# Patient Record
Sex: Female | Born: 1945 | Race: White | Hispanic: No | Marital: Married | Smoking: Current every day smoker
Health system: Southern US, Community
[De-identification: ages and names within clinical notes are randomized; demographics above are authoritative.]

## PROBLEM LIST (undated history)

## (undated) DIAGNOSIS — E7800 Pure hypercholesterolemia, unspecified: Secondary | ICD-10-CM

## (undated) DIAGNOSIS — K589 Irritable bowel syndrome without diarrhea: Secondary | ICD-10-CM

## (undated) DIAGNOSIS — I10 Essential (primary) hypertension: Secondary | ICD-10-CM

## (undated) DIAGNOSIS — C50919 Malignant neoplasm of unspecified site of unspecified female breast: Secondary | ICD-10-CM

## (undated) DIAGNOSIS — C50912 Malignant neoplasm of unspecified site of left female breast: Secondary | ICD-10-CM

## (undated) HISTORY — DX: Malignant neoplasm of unspecified site of left female breast: C50.912

## (undated) HISTORY — DX: Malignant neoplasm of unspecified site of unspecified female breast: C50.919

## (undated) HISTORY — PX: MASTECTOMY: SHX3

---

## 2006-10-04 ENCOUNTER — Ambulatory Visit: Payer: Self-pay | Admitting: Cardiology

## 2006-12-21 ENCOUNTER — Ambulatory Visit: Payer: Self-pay | Admitting: Physician Assistant

## 2006-12-21 ENCOUNTER — Ambulatory Visit: Payer: Self-pay | Admitting: Cardiology

## 2006-12-25 ENCOUNTER — Encounter: Admission: RE | Admit: 2006-12-25 | Discharge: 2006-12-25 | Payer: Self-pay | Admitting: *Deleted

## 2006-12-28 ENCOUNTER — Ambulatory Visit: Payer: Self-pay | Admitting: Cardiology

## 2007-01-30 ENCOUNTER — Ambulatory Visit: Payer: Self-pay | Admitting: Cardiology

## 2008-06-16 ENCOUNTER — Encounter: Admission: RE | Admit: 2008-06-16 | Discharge: 2008-06-16 | Payer: Self-pay | Admitting: Family Medicine

## 2008-06-27 ENCOUNTER — Encounter (INDEPENDENT_AMBULATORY_CARE_PROVIDER_SITE_OTHER): Payer: Self-pay | Admitting: Diagnostic Radiology

## 2008-06-27 ENCOUNTER — Encounter: Admission: RE | Admit: 2008-06-27 | Discharge: 2008-06-27 | Payer: Self-pay | Admitting: Family Medicine

## 2008-07-04 ENCOUNTER — Encounter: Admission: RE | Admit: 2008-07-04 | Discharge: 2008-07-04 | Payer: Self-pay | Admitting: Family Medicine

## 2008-07-08 ENCOUNTER — Encounter: Admission: RE | Admit: 2008-07-08 | Discharge: 2008-07-08 | Payer: Self-pay | Admitting: Family Medicine

## 2008-07-16 ENCOUNTER — Encounter (INDEPENDENT_AMBULATORY_CARE_PROVIDER_SITE_OTHER): Payer: Self-pay | Admitting: Diagnostic Radiology

## 2008-07-16 ENCOUNTER — Encounter: Admission: RE | Admit: 2008-07-16 | Discharge: 2008-07-16 | Payer: Self-pay | Admitting: Family Medicine

## 2008-07-17 ENCOUNTER — Ambulatory Visit: Admission: RE | Admit: 2008-07-17 | Discharge: 2008-08-11 | Payer: Self-pay | Admitting: Radiation Oncology

## 2008-07-17 ENCOUNTER — Encounter: Admission: RE | Admit: 2008-07-17 | Discharge: 2008-07-17 | Payer: Self-pay | Admitting: Family Medicine

## 2008-07-17 ENCOUNTER — Encounter (INDEPENDENT_AMBULATORY_CARE_PROVIDER_SITE_OTHER): Payer: Self-pay | Admitting: Radiology

## 2008-08-27 ENCOUNTER — Ambulatory Visit: Payer: Self-pay | Admitting: Oncology

## 2008-08-27 LAB — CBC WITH DIFFERENTIAL/PLATELET
BASO%: 0.8 % (ref 0.0–2.0)
Basophils Absolute: 0.1 10*3/uL (ref 0.0–0.1)
EOS%: 11.1 % — ABNORMAL HIGH (ref 0.0–7.0)
MCH: 31.7 pg (ref 26.0–34.0)
MCHC: 34 g/dL (ref 32.0–36.0)
MCV: 93.4 fL (ref 81.0–101.0)
MONO%: 8.2 % (ref 0.0–13.0)
RDW: 12.4 % (ref 11.3–14.5)
lymph#: 2 10*3/uL (ref 0.9–3.3)

## 2008-08-28 LAB — COMPREHENSIVE METABOLIC PANEL
ALT: 14 U/L (ref 0–35)
AST: 14 U/L (ref 0–37)
Albumin: 4.3 g/dL (ref 3.5–5.2)
Alkaline Phosphatase: 90 U/L (ref 39–117)
BUN: 9 mg/dL (ref 6–23)
Calcium: 9.1 mg/dL (ref 8.4–10.5)
Chloride: 105 mEq/L (ref 96–112)
Creatinine, Ser: 0.81 mg/dL (ref 0.40–1.20)
Potassium: 4.2 mEq/L (ref 3.5–5.3)

## 2008-10-24 ENCOUNTER — Ambulatory Visit: Payer: Self-pay | Admitting: Oncology

## 2008-10-28 LAB — CBC WITH DIFFERENTIAL/PLATELET
BASO%: 1.2 % (ref 0.0–2.0)
Basophils Absolute: 0.1 10*3/uL (ref 0.0–0.1)
EOS%: 3.1 % (ref 0.0–7.0)
HCT: 38.7 % (ref 34.8–46.6)
HGB: 13.2 g/dL (ref 11.6–15.9)
LYMPH%: 37.1 % (ref 14.0–49.7)
MCH: 31.5 pg (ref 25.1–34.0)
MCHC: 34.2 g/dL (ref 31.5–36.0)
MCV: 92.1 fL (ref 79.5–101.0)
MONO%: 8.1 % (ref 0.0–14.0)
NEUT%: 50.5 % (ref 38.4–76.8)
Platelets: 177 10*3/uL (ref 145–400)

## 2008-12-19 ENCOUNTER — Emergency Department (HOSPITAL_COMMUNITY): Admission: EM | Admit: 2008-12-19 | Discharge: 2008-12-19 | Payer: Self-pay | Admitting: Emergency Medicine

## 2009-01-02 ENCOUNTER — Ambulatory Visit: Payer: Self-pay | Admitting: Oncology

## 2009-01-06 ENCOUNTER — Encounter: Payer: Self-pay | Admitting: Cardiology

## 2009-01-16 LAB — RESEARCH LABS

## 2009-05-21 ENCOUNTER — Ambulatory Visit: Payer: Self-pay | Admitting: Oncology

## 2009-05-25 ENCOUNTER — Encounter: Payer: Self-pay | Admitting: Cardiology

## 2009-05-25 LAB — CBC WITH DIFFERENTIAL/PLATELET
BASO%: 0.7 % (ref 0.0–2.0)
Basophils Absolute: 0 10*3/uL (ref 0.0–0.1)
MCH: 29.1 pg (ref 25.1–34.0)
MCV: 87.3 fL (ref 79.5–101.0)
MONO#: 0.6 10*3/uL (ref 0.1–0.9)
NEUT#: 3.2 10*3/uL (ref 1.5–6.5)
NEUT%: 55.2 % (ref 38.4–76.8)
Platelets: 243 10*3/uL (ref 145–400)
RBC: 4.28 10*6/uL (ref 3.70–5.45)

## 2009-05-26 LAB — COMPREHENSIVE METABOLIC PANEL
ALT: 15 U/L (ref 0–35)
AST: 16 U/L (ref 0–37)
Albumin: 3.8 g/dL (ref 3.5–5.2)
BUN: 7 mg/dL (ref 6–23)
Calcium: 8.7 mg/dL (ref 8.4–10.5)
Chloride: 105 mEq/L (ref 96–112)
Potassium: 4.1 mEq/L (ref 3.5–5.3)
Sodium: 140 mEq/L (ref 135–145)
Total Protein: 6.5 g/dL (ref 6.0–8.3)

## 2009-10-06 ENCOUNTER — Ambulatory Visit (HOSPITAL_BASED_OUTPATIENT_CLINIC_OR_DEPARTMENT_OTHER): Admission: RE | Admit: 2009-10-06 | Discharge: 2009-10-06 | Payer: Self-pay | Admitting: Plastic Surgery

## 2009-11-19 ENCOUNTER — Ambulatory Visit: Payer: Self-pay | Admitting: Oncology

## 2009-11-23 ENCOUNTER — Encounter: Payer: Self-pay | Admitting: Cardiology

## 2009-11-23 LAB — COMPREHENSIVE METABOLIC PANEL
AST: 20 U/L (ref 0–37)
BUN: 11 mg/dL (ref 6–23)
Calcium: 8.9 mg/dL (ref 8.4–10.5)
Chloride: 106 mEq/L (ref 96–112)
Creatinine, Ser: 0.88 mg/dL (ref 0.40–1.20)

## 2009-11-23 LAB — CBC WITH DIFFERENTIAL/PLATELET
Basophils Absolute: 0 10*3/uL (ref 0.0–0.1)
EOS%: 2.5 % (ref 0.0–7.0)
HCT: 37.7 % (ref 34.8–46.6)
HGB: 13 g/dL (ref 11.6–15.9)
LYMPH%: 30 % (ref 14.0–49.7)
MCH: 31.8 pg (ref 25.1–34.0)
MCV: 92.4 fL (ref 79.5–101.0)
MONO%: 7.3 % (ref 0.0–14.0)
NEUT%: 59.4 % (ref 38.4–76.8)
Platelets: 193 10*3/uL (ref 145–400)

## 2010-07-12 ENCOUNTER — Ambulatory Visit (HOSPITAL_COMMUNITY): Admission: RE | Admit: 2010-07-12 | Discharge: 2010-07-12 | Payer: Self-pay | Admitting: Oncology

## 2010-07-19 ENCOUNTER — Ambulatory Visit: Payer: Self-pay | Admitting: Oncology

## 2010-07-21 ENCOUNTER — Encounter: Payer: Self-pay | Admitting: Cardiology

## 2010-07-21 LAB — COMPREHENSIVE METABOLIC PANEL
ALT: 12 U/L (ref 0–35)
AST: 17 U/L (ref 0–37)
Alkaline Phosphatase: 79 U/L (ref 39–117)
BUN: 9 mg/dL (ref 6–23)
Creatinine, Ser: 0.89 mg/dL (ref 0.40–1.20)
Potassium: 3.9 mEq/L (ref 3.5–5.3)
Sodium: 141 mEq/L (ref 135–145)
Total Bilirubin: 0.4 mg/dL (ref 0.3–1.2)

## 2010-07-21 LAB — CBC WITH DIFFERENTIAL/PLATELET
Eosinophils Absolute: 0.1 10*3/uL (ref 0.0–0.5)
LYMPH%: 27.3 % (ref 14.0–49.7)
MONO#: 0.4 10*3/uL (ref 0.1–0.9)
NEUT#: 3.1 10*3/uL (ref 1.5–6.5)
Platelets: 206 10*3/uL (ref 145–400)
RBC: 4.35 10*6/uL (ref 3.70–5.45)
RDW: 12 % (ref 11.2–14.5)
WBC: 5.1 10*3/uL (ref 3.9–10.3)

## 2010-09-26 ENCOUNTER — Encounter: Payer: Self-pay | Admitting: Family Medicine

## 2010-10-05 NOTE — Letter (Signed)
Summary: Internal Correspondence/ [facility]  Internal Correspondence/ [facility]   Imported By: Dorise Hiss 12/08/2009 15:53:38  _____________________________________________________________________  External Attachment:    Type:   Image     Comment:   External Document

## 2010-10-07 NOTE — Letter (Signed)
Summary: Internal Correspondence/ [facility]  Internal Correspondence/ [facility]   Imported By: Dorise Hiss 09/28/2010 12:22:16  _____________________________________________________________________  External Attachment:    Type:   Image     Comment:   External Document

## 2010-11-21 LAB — BASIC METABOLIC PANEL
BUN: 9 mg/dL (ref 6–23)
CO2: 28 mEq/L (ref 19–32)
Chloride: 103 mEq/L (ref 96–112)
GFR calc non Af Amer: >60 mL/min (ref >60)
Glucose, Bld: 127 mg/dL — ABNORMAL HIGH (ref 70–99)
Potassium: 4.4 mEq/L (ref 3.5–5.1)
Sodium: 138 mEq/L (ref 135–145)

## 2010-12-15 LAB — POCT I-STAT, CHEM 8
BUN: 6 mg/dL (ref 6–23)
Chloride: 104 mEq/L (ref 96–112)
Creatinine, Ser: 0.8 mg/dL (ref 0.4–1.2)
Glucose, Bld: 97 mg/dL (ref 70–99)
Potassium: 4.3 mEq/L (ref 3.5–5.1)
Sodium: 138 mEq/L (ref 135–145)

## 2010-12-15 LAB — COMPREHENSIVE METABOLIC PANEL
ALT: 30 U/L (ref 0–35)
BUN: 7 mg/dL (ref 6–23)
CO2: 27 mEq/L (ref 19–32)
Calcium: 8.5 mg/dL (ref 8.4–10.5)
GFR calc non Af Amer: >60 mL/min (ref >60)
Glucose, Bld: 96 mg/dL (ref 70–99)
Sodium: 139 mEq/L (ref 135–145)

## 2010-12-15 LAB — PROTIME-INR
INR: 0.9 (ref 0.00–1.49)
Prothrombin Time: 12.7 seconds (ref 11.6–15.2)

## 2010-12-15 LAB — CBC
HCT: 38.8 % (ref 36.0–46.0)
Hemoglobin: 12.8 g/dL (ref 12.0–15.0)
MCHC: 32.9 g/dL (ref 30.0–36.0)
MCV: 93.7 fL (ref 78.0–100.0)
RBC: 4.14 MIL/uL (ref 3.87–5.11)

## 2010-12-15 LAB — DIFFERENTIAL
Basophils Relative: 1 % (ref 0–1)
Eosinophils Absolute: 0.3 10*3/uL (ref 0.0–0.7)
Lymphs Abs: 1.1 10*3/uL (ref 0.7–4.0)
Neutro Abs: 2.9 10*3/uL (ref 1.7–7.7)
Neutrophils Relative %: 54 % (ref 43–77)

## 2011-01-19 ENCOUNTER — Other Ambulatory Visit: Payer: Self-pay | Admitting: Oncology

## 2011-01-19 ENCOUNTER — Encounter (HOSPITAL_BASED_OUTPATIENT_CLINIC_OR_DEPARTMENT_OTHER): Payer: Medicare Other | Admitting: Oncology

## 2011-01-19 DIAGNOSIS — Z170 Estrogen receptor positive status [ER+]: Secondary | ICD-10-CM

## 2011-01-19 DIAGNOSIS — C50919 Malignant neoplasm of unspecified site of unspecified female breast: Secondary | ICD-10-CM

## 2011-01-19 LAB — CBC WITH DIFFERENTIAL/PLATELET
Basophils Absolute: 0 10*3/uL (ref 0.0–0.1)
EOS%: 1.4 % (ref 0.0–7.0)
Eosinophils Absolute: 0.1 10*3/uL (ref 0.0–0.5)
HCT: 38.9 % (ref 34.8–46.6)
HGB: 13 g/dL (ref 11.6–15.9)
LYMPH%: 20.4 % (ref 14.0–49.7)
MCH: 30.2 pg (ref 25.1–34.0)
MCV: 90.3 fL (ref 79.5–101.0)
MONO%: 9.1 % (ref 0.0–14.0)
NEUT#: 5.4 10*3/uL (ref 1.5–6.5)
NEUT%: 68.8 % (ref 38.4–76.8)
Platelets: 152 10*3/uL (ref 145–400)
RDW: 12.3 % (ref 11.2–14.5)

## 2011-01-21 NOTE — Assessment & Plan Note (Signed)
[facility]                          [facility] NOTE   JENNAVECIA, SCHWIER       MRN:          [id number]  DATE:12/21/2006                            DOB:          [birth year]    PRIMARY CARE PHYSICIAN:  Dr. Reuel Boom.   HISTORY OF PRESENT ILLNESS:  Ms. Tammy Ewing is a 65 year old female patient  with no known history of coronary artery disease who is referred to the  office today for tachycardia by her primary care physician.  She has  been fairly asymptomatic with her tachycardia.  This was noted at [facility] several months ago.  She did not know that her heart rate was fast.  She has seen Dr. Reuel Boom, who has checked a thyroid level.  Apparently,  her TSH was mildly elevated.  She does not require therapy at this time.  She denies any palpitations, chest pain, shortness of breath.  She  denies any syncope.  She does note an episode of near-syncope after  getting out of bed and getting into the shower.  This lasted for several  minutes, but eventually resolved.  It sounds as though she was probably  having a vagal episode.  She denies orthopnea, paroxysmal nocturnal  dyspnea, or edema.  She denies dyspnea on exertion.  She does note  fatigue.  This has been ongoing for quite some time now.  Her husband is  with her today, who also notes that she snores.   PAST MEDICAL HISTORY:  Significant for treated dyslipidemia, irritable  bowel syndrome, osteopenia.  She is status post cholecystectomy,  oophorectomy, cyst removed from her back, breast biopsy, and  tonsillectomy.  She denies any history of hypertension, diabetes  mellitus.   CURRENT MEDICATIONS:  1. Imipramine 25 mg a day.  2. Metoprolol 25 mg a day.  3. Simvastatin 40 mg a day.  4. Zyrtec 10 mg a day.  5. Aspirin 81 mg a day.  6. Vitamin.  7. Calcium.  8. Fiber.  9. Fosamax.   ALLERGIES:  None.   SOCIAL HISTORY:  She smokes a pack a day and has smoked for  about 40  years.  Denies alcohol or drug abuse.  She is married.  She has 1 son  who is 69 years old.  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   FAMILY HISTORY:  Significant for coronary artery disease.  Her mother  had a myocardial infarction in her 96s.  She is still alive.   REVIEW OF SYSTEMS:  Please see HPI.  She denies any fevers, chills,  cough.  No hematochezia, hematuria, or dysuria.  She has occasional  dyspepsia.  The rest of the review of systems are negative.   PHYSICAL EXAM:  She is a well-nourished, well-developed female in no  acute distress.  Blood pressure 119/75, pulse 77, weight 168 pounds.  HEENT:  Normocephalic, atraumatic.  PERRLA.  Maxilla clear.  NECK:  Without JVD.  No lymphadenopathy.  No thyromegaly.  Carotids  without bruits bilaterally.  CARDIAC:  S1, S2.  Regular rate and rhythm without murmurs.  LUNGS:  Clear to auscultation bilaterally without wheeze, rales, or  rhonchi.  ABDOMEN:  Soft and nontender.  Normoactive bowel sounds.  No  organomegaly.  EXTREMITIES:  Without edema.  Calves are soft and nontender.  SKIN:  Warm and dry.  NEUROLOGIC:  She is alert and oriented x3.  Cranial nerves 2-12 are  grossly intact.  Dorsalis pedis and posterior tibialis pulses 2+ bilaterally.   ELECTROCARDIOGRAM:  Sinus rhythm with heart rate of 71.  No acute  changes.   IMPRESSION:  1. Tachycardia.  2. Treated dyslipidemia.  3. Sub-clinical hypothyroidism.  4. Irritable bowel syndrome.  5. Osteopenia.  6. Family history of coronary artery disease.  7. Smoker.  8. Fatigue.   PLAN:  The patient presents to the office today for evaluation of  tachycardia.  A recent stress nuclear test was negative for ischemia.  She has also been set up for an echocardiogram, which was done  yesterday.  She is fairly asymptomatic with the tachycardia.  She did  have 1 near-syncopal episode about a month ago.  This sounds as though  she did have a  vasovagal episode.  She notes symptoms of snoring and  fatigue.  We will set her up for a sleep study to rule out sleep apnea.  We will also place her on a 48 hour Holter monitor to rule out  significant arrhythmias.  Imipramine is a medication that can cause  tachycardia.  We have recommended to her to go ahead and hold this for  now to see if this alleviates some of the tachycardia.  We will await  the results of her echocardiogram and try to obtain results from Dr.  Reuel Boom from recent  blood work.  We will have her followup with Dr. Andee Lineman in 1 month's  time.  I did discuss the patient's case today with Dr. Andee Lineman.      Tereso Newcomer, PA-C  Electronically Signed      Learta Codding, MD,FACC  Electronically Signed   SW/MedQ  DD: 12/21/2006  DT: 12/21/2006  Job #: [id number]   cc:   Donzetta Sprung

## 2011-07-14 ENCOUNTER — Ambulatory Visit (HOSPITAL_COMMUNITY)
Admission: RE | Admit: 2011-07-14 | Discharge: 2011-07-14 | Disposition: A | Discharge status: Home / Self Care | Payer: Medicare Other | Source: Ambulatory Visit | Attending: Oncology | Admitting: Oncology

## 2011-07-14 ENCOUNTER — Ambulatory Visit (HOSPITAL_BASED_OUTPATIENT_CLINIC_OR_DEPARTMENT_OTHER): Payer: Medicare Other

## 2011-07-14 ENCOUNTER — Other Ambulatory Visit: Payer: Self-pay | Admitting: Oncology

## 2011-07-14 DIAGNOSIS — C50919 Malignant neoplasm of unspecified site of unspecified female breast: Secondary | ICD-10-CM

## 2011-07-14 DIAGNOSIS — Z170 Estrogen receptor positive status [ER+]: Secondary | ICD-10-CM

## 2011-07-14 DIAGNOSIS — C50219 Malignant neoplasm of upper-inner quadrant of unspecified female breast: Secondary | ICD-10-CM

## 2011-07-14 LAB — CBC WITH DIFFERENTIAL/PLATELET
Eosinophils Absolute: 0.1 10*3/uL (ref 0.0–0.5)
HCT: 41.3 % (ref 34.8–46.6)
LYMPH%: 25.8 % (ref 14.0–49.7)
MCV: 91.1 fL (ref 79.5–101.0)
MONO#: 0.4 10*3/uL (ref 0.1–0.9)
MONO%: 7.8 % (ref 0.0–14.0)
NEUT#: 3.4 10*3/uL (ref 1.5–6.5)
NEUT%: 64.4 % (ref 38.4–76.8)
Platelets: 186 10*3/uL (ref 145–400)
WBC: 5.3 10*3/uL (ref 3.9–10.3)

## 2011-07-14 LAB — COMPREHENSIVE METABOLIC PANEL
Alkaline Phosphatase: 80 U/L (ref 39–117)
BUN: 6 mg/dL (ref 6–23)
CO2: 24 mEq/L (ref 19–32)
Creatinine, Ser: 0.83 mg/dL (ref 0.50–1.10)
Glucose, Bld: 98 mg/dL (ref 70–99)
Total Bilirubin: 0.5 mg/dL (ref 0.3–1.2)
Total Protein: 6.6 g/dL (ref 6.0–8.3)

## 2011-07-14 LAB — CANCER ANTIGEN 27.29: CA 27.29: 10 U/mL (ref 0–39)

## 2011-07-21 ENCOUNTER — Encounter: Payer: Self-pay | Admitting: Physician Assistant

## 2011-07-21 ENCOUNTER — Ambulatory Visit (HOSPITAL_BASED_OUTPATIENT_CLINIC_OR_DEPARTMENT_OTHER): Payer: Medicare Other | Admitting: Physician Assistant

## 2011-07-21 VITALS — BP 164/71 | HR 76 | Temp 98.6°F | Ht 62.5 in | Wt 151.3 lb

## 2011-07-21 DIAGNOSIS — C50911 Malignant neoplasm of unspecified site of right female breast: Secondary | ICD-10-CM | POA: Insufficient documentation

## 2011-07-21 DIAGNOSIS — C50912 Malignant neoplasm of unspecified site of left female breast: Secondary | ICD-10-CM

## 2011-07-21 DIAGNOSIS — Z170 Estrogen receptor positive status [ER+]: Secondary | ICD-10-CM

## 2011-07-21 DIAGNOSIS — C50219 Malignant neoplasm of upper-inner quadrant of unspecified female breast: Secondary | ICD-10-CM

## 2011-07-21 DIAGNOSIS — C50919 Malignant neoplasm of unspecified site of unspecified female breast: Secondary | ICD-10-CM

## 2011-07-21 HISTORY — DX: Malignant neoplasm of unspecified site of left female breast: C50.912

## 2011-07-21 HISTORY — DX: Malignant neoplasm of unspecified site of unspecified female breast: C50.919

## 2011-07-21 NOTE — Progress Notes (Signed)
Hematology and Oncology Follow Up Visit  Tammy Ewing [id number] 04/30/1946 65 y.o. 07/21/2011 3:57 PM   Interim History:   The patient returns today for routine six-month followup of her bilateral breast carcinomas. She continues on letrozole which she began in January of 2012, having completed 2 years on tamoxifen tamoxifen. She is tolerating the letrozole well. A detailed review of systems is unremarkable. The patient keeps herself busy, and enjoys taking care of her great grandson who is almost 68 months old.  Physically, the patient has very few complaints today. She's had no recent illnesses. She has some occasional hot flashes with the letrozole, nothing that is problematic to her. She's had no increasing joint pain. No vaginal dryness. She is status post bilateral corneal transplants, one in February 2012, the second in April 2012. Her vision is much better, now 20/30. She has no double vision, no blurred vision.  A detailed review of systems is otherwise noncontributory as noted below.  Review of Systems: Constitutional:  no weight loss, fever, night sweats and feels well Eyes: negative [id number] Cardiovascular: no chest pain or dyspnea on exertion Respiratory: no cough, shortness of breath, or wheezing Neurological: negative Dermatological: negative Gastrointestinal: no abdominal pain, change in bowel habits, or black or bloody stools Genito-Urinary: no dysuria, trouble voiding, or hematuria Hematological and Lymphatic: negative Breast: negative for breast lumps Musculoskeletal: negative Remaining ROS negative.  Medications: I have reviewed the patient's current medications.  Allergies:  Allergies  Allergen Reactions  . Lyrica Other (See Comments)    "Hallucinations"     Physical Exam:  Blood pressure 164/71, pulse 76, temperature 98.6 F (37 C), height 5' 2.5" (1.588 m), weight 151 lb 4.8 oz (68.629 kg). HEENT:  Sclerae anicteric, conjunctivae pink.   Oropharynx clear.  No mucositis or candidiasis.  Nodes:  No cervical, supraclavicular, or axillary lymphadenopathy palpated.  Breast Exam: The patient is status post bilateral mastectomies. Well-healed incisions, with no nodularity, skin changes, or evidence of local recurrence in the chest wall.  Lungs:  Clear to auscultation bilaterally.  No crackles, rhonchi, or wheezes.  Heart:  Regular rate and rhythm.  Abdomen:  Soft, nontender.  Positive bowel sounds.  No organomegaly or masses palpated.  Musculoskeletal:  No focal spinal tenderness to palpation.  Extremities:  Benign.  No peripheral edema or cyanosis.  Skin:  Benign.  Neuro:  Nonfocal.   Lab Results: Lab Results  Component Value Date   WBC 5.3 07/14/2011   HGB 13.9 07/14/2011   HCT 41.3 07/14/2011   MCV 91.1 07/14/2011   PLT 186 07/14/2011   NEUTROABS 2.9 12/19/2008     Chemistry      Component Value Date/Time   NA 138 07/14/2011 1153   NA 138 07/14/2011 1153   K 3.7 07/14/2011 1153   K 3.7 07/14/2011 1153   CL 104 07/14/2011 1153   CL 104 07/14/2011 1153   CO2 24 07/14/2011 1153   CO2 24 07/14/2011 1153   BUN 6 07/14/2011 1153   BUN 6 07/14/2011 1153   CREATININE 0.83 07/14/2011 1153   CREATININE 0.83 07/14/2011 1153      Component Value Date/Time   CALCIUM 9.3 07/14/2011 1153   CALCIUM 9.3 07/14/2011 1153   ALKPHOS 80 07/14/2011 1153   ALKPHOS 80 07/14/2011 1153   AST 13 07/14/2011 1153   AST 13 07/14/2011 1153   ALT <8 07/14/2011 1153   ALT <8 07/14/2011 1153   BILITOT 0.5 07/14/2011 1153   BILITOT 0.5 07/14/2011 1153  Radiological Studies: Chest x-ray on 07/14/2011 is unremarkable. There was no acute cardiopulmonary disease, and no evidence of metastasis. The patient is due for her next bone density exam at [facility] in 2013, and these are typically ordered by her PCP, Dr. Reuel Boom.  Impression and Plan: 65 year old [place] woman status post bilateral mastectomies in November of 2009 for a stage IA breast  carcinoma. On the right, T1b N 0. On the left, T1c, N0. Both invasive ductal carcinomas, but ER and PR positive, both HER-2/neu negative, and both with a low proliferation fraction. On tamoxifen from December of 2009 until January 2012, at which time she was switched to letrozole. Continues now on letrozole 2.5 mg daily with good tolerance  The patient continues to tolerate letrozole well and we'll continue as planned, the goal being to continue until December 2014 at which time we will likely release her from followup. She tells me that Dr. Reuel Boom typically orders her letrozole for her and will continue to do so. Of course she knows she can contact us for refills as needed. She will also followup with Dr. Reuel Boom and make sure that her bone density is ordered for next year. She does see him every 5-6 months for routine followup and, per her report, is up-to-date with all health maintenance. The patient returned for retained followup here in May of 2013.  This plan was reviewed with the patient, who voices understanding and agreement.  She knows to call with any changes or problems.    Solenne Manwarren, PA-C 11/15/20123:57 PM

## 2011-07-22 ENCOUNTER — Other Ambulatory Visit: Payer: Self-pay | Admitting: *Deleted

## 2011-07-22 MED ORDER — LETROZOLE 2.5 MG PO TABS: 2.5000 mg | ORAL_TABLET | Freq: Every day | ORAL | Status: DC

## 2011-07-22 MED ORDER — MEGESTROL ACETATE 40 MG PO TABS: 40.0000 mg | ORAL_TABLET | Freq: Every day | ORAL | Status: DC

## 2012-01-09 ENCOUNTER — Other Ambulatory Visit (HOSPITAL_BASED_OUTPATIENT_CLINIC_OR_DEPARTMENT_OTHER): Payer: Medicare Other | Admitting: Lab

## 2012-01-09 DIAGNOSIS — C50919 Malignant neoplasm of unspecified site of unspecified female breast: Secondary | ICD-10-CM

## 2012-01-09 LAB — CBC WITH DIFFERENTIAL/PLATELET
Basophils Absolute: 0.1 10*3/uL (ref 0.0–0.1)
Eosinophils Absolute: 0.2 10*3/uL (ref 0.0–0.5)
HGB: 13.9 g/dL (ref 11.6–15.9)
LYMPH%: 25.7 % (ref 14.0–49.7)
MCV: 91.5 fL (ref 79.5–101.0)
MONO%: 10.6 % (ref 0.0–14.0)
NEUT#: 3.2 10*3/uL (ref 1.5–6.5)
Platelets: 195 10*3/uL (ref 145–400)

## 2012-01-09 LAB — COMPREHENSIVE METABOLIC PANEL
ALT: 12 U/L (ref 0–35)
BUN: 8 mg/dL (ref 6–23)
CO2: 26 mEq/L (ref 19–32)
Calcium: 9.1 mg/dL (ref 8.4–10.5)
Chloride: 107 mEq/L (ref 96–112)
Creatinine, Ser: 0.95 mg/dL (ref 0.50–1.10)
Glucose, Bld: 91 mg/dL (ref 70–99)

## 2012-01-16 ENCOUNTER — Telehealth: Payer: Self-pay | Admitting: Oncology

## 2012-01-16 ENCOUNTER — Ambulatory Visit (HOSPITAL_BASED_OUTPATIENT_CLINIC_OR_DEPARTMENT_OTHER): Payer: Medicare Other | Admitting: Oncology

## 2012-01-16 VITALS — BP 150/77 | HR 73 | Temp 98.3°F | Ht 62.5 in | Wt 160.5 lb

## 2012-01-16 DIAGNOSIS — C50919 Malignant neoplasm of unspecified site of unspecified female breast: Secondary | ICD-10-CM

## 2012-01-16 DIAGNOSIS — Z9010 Acquired absence of unspecified breast and nipple: Secondary | ICD-10-CM

## 2012-01-16 DIAGNOSIS — C50912 Malignant neoplasm of unspecified site of left female breast: Secondary | ICD-10-CM

## 2012-01-16 DIAGNOSIS — C50219 Malignant neoplasm of upper-inner quadrant of unspecified female breast: Secondary | ICD-10-CM

## 2012-01-16 DIAGNOSIS — C50911 Malignant neoplasm of unspecified site of right female breast: Secondary | ICD-10-CM

## 2012-01-16 DIAGNOSIS — Z171 Estrogen receptor negative status [ER-]: Secondary | ICD-10-CM

## 2012-01-16 MED ORDER — LETROZOLE 2.5 MG PO TABS: 2.5000 mg | ORAL_TABLET | Freq: Every day | ORAL | Status: DC

## 2012-01-16 NOTE — Telephone Encounter (Signed)
gve the pt her may 2013 appt calendar 

## 2012-01-16 NOTE — Progress Notes (Signed)
ID: Tammy Ewing   DOB: 1946-01-09  MR#: [id number]  WUJ#:[id number]  HISTORY OF PRESENT ILLNESS: She had screening mammography at [facility] on 06/16/2008.  This showed some microcalcifications and distortion in the right breast.  She proceeded to right mammography and ultrasound on 06/27/2008 and additional images showed a group of indeterminate calcifications with mild parenchymal distortion on the right.  Ultrasound showed an ill-defined region initially but this could not be reproduced on a second attempt.  A second area of ill-defined shadowing was noted at a different place correlating with the mammographic abnormality.  This second area was biopsied on the same day and showed ([id number]) a low-grade invasive ductal carcinoma.  The sampling measured a maximum of 1.9 cm in length.    With this information, the patient proceeded to bilateral breast MRIs on 07/04/2008.  This showed a dense enhancing fibronodular pattern bilaterally with multiple cysts in the right breast.  In the right breast there was post-biopsy change.  In the left breast, there was an enhancing mass measuring up to 1.6 cm.  This was further evaluated with ultrasonography on the same day and it confirmed the presence of an irregular hypoechoic area measuring 1.4 cm maximally.  With many fibrocystic changes in the breast, it was difficult to determine if the exact area was found.  Biopsy was performed under MRI guidance on 07/16/2008, but the final pathology ([id number]) showed benign breast tissue only.   Dr. Judyann Munson really felt this was discordant, so on the next day, on 07/17/2008, under ultrasound guidance, repeat left breast biopsy was obtained and this time it showed ([id number]) an invasive ductal carcinoma low grade with the maximal size of the sample being 8 mm.  Both tumors were low grade, both had low MIB-1 ratings, and both were strongly ER and PR positive and ER negative as detailed below.  With this information,  after much discussion and particularly given the patient's very complex and "busy" breast parenchyma, the patient decided on bilateral mastectomies and these were performed on 08/04/2008 under [facility].  The final pathology ([id number]) showed no residual tumor in either breast.  I should add that this was reviewed by Dr. [place], who did additional cuts. Her subsequent history is as detailed below.    INTERVAL HISTORY: [name] returns today with her husband Tammy Ewing for followup of her breast cancer. The interval history is chiefly significant or their spending a lot of time with her great-grandson Lem. The whole family recently went to the beach and this was a memorable location and 4 past.  REVIEW OF SYSTEMS: She is losing some of her hair doubtless due to the letrozole, but this is certainly not noticeable. Her hot flashes are much better than they used to be. She has some vaginal dryness but this has not changed. Her hearing aid is broken and she is considering whether she needs to get that replaced. Otherwise a detailed review of systems today was entirely negative  PAST MEDICAL HISTORY: Past Medical History  Diagnosis Date  . Breast cancer 07/21/2011  . Breast cancer, left breast 07/21/2011  She is status post bilateral salpingo-oophorectomy without hysterectomy.  She has a history of dyslipidemia. She has a history of corneal dystrophy.  She has a history of irritable bowel syndrome.  She is status post cholecystectomy, she is status post tonsillectomy and adenoidectomy.  She has a history of significant tobacco abuse, the patient quitting in November of this year after getting the news just discussed.  The  patient has a history of tachycardia, which was evaluated by Dr. Andee Lineman with no significant pathology found.  She has a history of subclinical hypothyroidism, a history of osteopenia, and a possible history of sleep apnea, not study confirmed.  FAMILY HISTORY The patient has no information  regarding her father.  Her mother is currently 55 years old.  She has a history of coronary artery disease and emphysema.  The patient has 2 brothers and 1 sister.  There is no history of ovarian, breast, or any other cancer in the immediate family.   GYNECOLOGIC HISTORY: She is GX, P1, first pregnancy age 52.  After having her BSO (which the patient insisted on for "prevention") she took hormone replacement until November 2011   SOCIAL HISTORY: She is retired from [employer], currently works at a [employer] part time.  Her husband, Tammy Ewing, is retired.  Their son, Tammy Ewing, lives in [place], is disabled after some workplace injury and has had, the patient says, approximately a dozen back surgeries.  The patient has a grandson who lives in [place] and a 62-year-old Surveyor, mining.  The patient is not a church attender.   ADVANCED DIRECTIVES: not in place  HEALTH MAINTENANCE: History  Substance Use Topics  . Smoking status: Current Everyday Smoker -- 1.0 packs/day for 50 years    Types: Cigarettes  . Smokeless tobacco: Never Used  . Alcohol Use:      Colonoscopy:  PAP:  Bone density: due 2014  Lipid panel:  Allergies  Allergen Reactions  . Pregabalin Other (See Comments)    "Hallucinations"    Current Outpatient Prescriptions  Medication Sig Dispense Refill  . imipramine (TOFRANIL) 25 MG tablet Take 25 mg by mouth at bedtime.        Marland Kitchen letrozole (FEMARA) 2.5 MG tablet Take 1 tablet (2.5 mg total) by mouth daily.  90 tablet  4  . levothyroxine (SYNTHROID, LEVOTHROID) 88 MCG tablet Take 88 mcg by mouth daily.        . metoprolol (TOPROL-XL) 50 MG 24 hr tablet Take 50 mg by mouth daily.        . polycarbophil (FIBERCON) 625 MG tablet Take 625 mg by mouth daily.        . prednisoLONE acetate (PRED MILD) 0.12 % ophthalmic suspension Place 1 drop into both eyes. Monday Wednesday and Friday      . simvastatin (ZOCOR) 40 MG tablet Take 40 mg by mouth at bedtime.        . metronidazole (NORITATE) 1  % cream Apply 1 application topically daily.          OBJECTIVE: Middle-aged white woman who appears well Filed Vitals:   01/16/12 1456  BP: 150/77  Pulse: 73  Temp: 98.3 F (36.8 C)     Body mass index is 28.89 kg/(m^2).    ECOG FS: 0  Sclerae unicteric Oropharynx clear No peripheral adenopathy Lungs no rales or rhonchi Heart regular rate and rhythm Abd benign MSK no focal spinal tenderness, no peripheral edema Neuro: nonfocal Breasts: Status post bilateral mastectomies. No evidence of local recurrence  LAB RESULTS: Lab Results  Component Value Date   WBC 5.5 01/09/2012   NEUTROABS 3.2 01/09/2012   HGB 13.9 01/09/2012   HCT 41.8 01/09/2012   MCV 91.5 01/09/2012   PLT 195 01/09/2012      Chemistry      Component Value Date/Time   NA 142 01/09/2012 1050   K 4.0 01/09/2012 1050   CL 107 01/09/2012 1050  CO2 26 01/09/2012 1050   BUN 8 01/09/2012 1050   CREATININE 0.95 01/09/2012 1050      Component Value Date/Time   CALCIUM 9.1 01/09/2012 1050   ALKPHOS 88 01/09/2012 1050   AST 18 01/09/2012 1050   ALT 12 01/09/2012 1050   BILITOT 0.4 01/09/2012 1050       Lab Results  Component Value Date   LABCA2 15 01/09/2012    No components found with this basename: LABCA125    No results found for this basename: INR:1;PROTIME:1 in the last 168 hours  Urinalysis No results found for this basename: colorurine, appearanceur, labspec, phurine, glucoseu, hgbur, bilirubinur, ketonesur, proteinur, urobilinogen, nitrite, leukocytesur    STUDIES: No results found.  ASSESSMENT: 66 year old [place] woman, status post bilateral mastectomies, November 2009, for a right-sided T1B N0 and a left-sided T1C N0 invasive ductal carcinomas, both estrogen and progesterone receptor positive, both with a low MIB-1 and both HER2 negative.  She started tamoxifen in December 2009 and we switched her to letrozole in January of 2012.   PLAN: She is doing very well from my point of view. We are going to continue the  letrozole one more year. She will see me year from now, and likely we'll release her from followup here at that time   Alisah Grandberry C    01/16/2012

## 2012-05-11 ENCOUNTER — Other Ambulatory Visit: Payer: Self-pay | Admitting: Oncology

## 2012-05-11 DIAGNOSIS — C50919 Malignant neoplasm of unspecified site of unspecified female breast: Secondary | ICD-10-CM

## 2013-01-09 ENCOUNTER — Other Ambulatory Visit: Payer: Self-pay | Admitting: Physician Assistant

## 2013-01-09 DIAGNOSIS — C50912 Malignant neoplasm of unspecified site of left female breast: Secondary | ICD-10-CM

## 2013-01-09 DIAGNOSIS — C50911 Malignant neoplasm of unspecified site of right female breast: Secondary | ICD-10-CM

## 2013-01-10 ENCOUNTER — Other Ambulatory Visit (HOSPITAL_BASED_OUTPATIENT_CLINIC_OR_DEPARTMENT_OTHER): Payer: Medicare Other | Admitting: Lab

## 2013-01-10 DIAGNOSIS — C50919 Malignant neoplasm of unspecified site of unspecified female breast: Secondary | ICD-10-CM

## 2013-01-10 DIAGNOSIS — C50219 Malignant neoplasm of upper-inner quadrant of unspecified female breast: Secondary | ICD-10-CM

## 2013-01-10 DIAGNOSIS — C50911 Malignant neoplasm of unspecified site of right female breast: Secondary | ICD-10-CM

## 2013-01-10 DIAGNOSIS — C50912 Malignant neoplasm of unspecified site of left female breast: Secondary | ICD-10-CM

## 2013-01-10 LAB — CBC WITH DIFFERENTIAL/PLATELET
BASO%: 3 % — ABNORMAL HIGH (ref 0.0–2.0)
Basophils Absolute: 0.2 10*3/uL — ABNORMAL HIGH (ref 0.0–0.1)
EOS%: 2.6 % (ref 0.0–7.0)
Eosinophils Absolute: 0.2 10*3/uL (ref 0.0–0.5)
HCT: 41 % (ref 34.8–46.6)
HGB: 14.1 g/dL (ref 11.6–15.9)
LYMPH%: 30.2 % (ref 14.0–49.7)
MCH: 30.9 pg (ref 25.1–34.0)
MCHC: 34.3 g/dL (ref 31.5–36.0)
MCV: 90.1 fL (ref 79.5–101.0)
MONO#: 0.5 10*3/uL (ref 0.1–0.9)
MONO%: 9.1 % (ref 0.0–14.0)
NEUT#: 3.2 10*3/uL (ref 1.5–6.5)
NEUT%: 55.1 % (ref 38.4–76.8)
Platelets: 193 10*3/uL (ref 145–400)
RBC: 4.55 10*6/uL (ref 3.70–5.45)
RDW: 13 % (ref 11.2–14.5)
WBC: 5.8 10*3/uL (ref 3.9–10.3)
lymph#: 1.7 10*3/uL (ref 0.9–3.3)

## 2013-01-10 LAB — COMPREHENSIVE METABOLIC PANEL (CC13)
ALT: 9 U/L (ref 0–55)
AST: 14 U/L (ref 5–34)
Albumin: 3.7 g/dL (ref 3.5–5.0)
Alkaline Phosphatase: 99 U/L (ref 40–150)
BUN: 8.2 mg/dL (ref 7.0–26.0)
CO2: 24 mEq/L (ref 22–29)
Calcium: 9 mg/dL (ref 8.4–10.4)
Chloride: 107 mEq/L (ref 98–107)
Creatinine: 1.1 mg/dL (ref 0.6–1.1)
Glucose: 101 mg/dl — ABNORMAL HIGH (ref 70–99)
Potassium: 4.3 mEq/L (ref 3.5–5.1)
Sodium: 142 mEq/L (ref 136–145)
Total Bilirubin: 0.74 mg/dL (ref 0.20–1.20)
Total Protein: 6.6 g/dL (ref 6.4–8.3)

## 2013-01-17 ENCOUNTER — Ambulatory Visit (HOSPITAL_BASED_OUTPATIENT_CLINIC_OR_DEPARTMENT_OTHER): Payer: Medicare Other | Admitting: Oncology

## 2013-01-17 VITALS — BP 119/75 | HR 80 | Temp 98.2°F | Resp 20 | Ht 62.0 in | Wt 167.2 lb

## 2013-01-17 DIAGNOSIS — C50912 Malignant neoplasm of unspecified site of left female breast: Secondary | ICD-10-CM

## 2013-01-17 DIAGNOSIS — C50911 Malignant neoplasm of unspecified site of right female breast: Secondary | ICD-10-CM

## 2013-01-17 DIAGNOSIS — C50919 Malignant neoplasm of unspecified site of unspecified female breast: Secondary | ICD-10-CM

## 2013-01-17 NOTE — Progress Notes (Signed)
ID: Tammy Ewing   DOB: Sep 20, 1945  MR#: [id number]  CSN#:[id number]  PCP: Donzetta Sprung, MD   HISTORY OF PRESENT ILLNESS: She had screening mammography at [facility] on 06/16/2008.  This showed some microcalcifications and distortion in the right breast.  She proceeded to right mammography and ultrasound on 06/27/2008 and additional images showed a group of indeterminate calcifications with mild parenchymal distortion on the right.  Ultrasound showed an ill-defined region initially but this could not be reproduced on a second attempt.  A second area of ill-defined shadowing was noted at a different place correlating with the mammographic abnormality.  This second area was biopsied on the same day and showed ([id number]) a low-grade invasive ductal carcinoma.  The sampling measured a maximum of 1.9 cm in length.    With this information, the patient proceeded to bilateral breast MRIs on 07/04/2008.  This showed a dense enhancing fibronodular pattern bilaterally with multiple cysts in the right breast.  In the right breast there was post-biopsy change.  In the left breast, there was an enhancing mass measuring up to 1.6 cm.  This was further evaluated with ultrasonography on the same day and it confirmed the presence of an irregular hypoechoic area measuring 1.4 cm maximally.  With many fibrocystic changes in the breast, it was difficult to determine if the exact area was found.  Biopsy was performed under MRI guidance on 07/16/2008, but the final pathology ([id number]) showed benign breast tissue only.   Dr. Judyann Munson really felt this was discordant, so on the next day, on 07/17/2008, under ultrasound guidance, repeat left breast biopsy was obtained and this time it showed ([id number]) an invasive ductal carcinoma low grade with the maximal size of the sample being 8 mm.  Both tumors were low grade, both had low MIB-1 ratings, and both were strongly ER and PR positive and ER negative as detailed  below.  With this information, after much discussion and particularly given the patient's very complex and "busy" breast parenchyma, the patient decided on bilateral mastectomies and these were performed on 08/04/2008 under [facility].  The final pathology ([id number]) showed no residual tumor in either breast.  I should add that this was reviewed by Dr. [place], who did additional cuts. Her subsequent history is as detailed below.    INTERVAL HISTORY: Tammy Ewing returns today with her husband [name] for followup of her breast cancer. The interval history is unremarkable, except for her mother having died at the age of 47 after a fall. Tammy Ewing spending a lot of time taking care of her 68-year-old great-grandson however he is going to soon be in a school and she is a ready wondering what she is going to do with her time.  REVIEW OF SYSTEMS: The chest wound that had been giving her so much trouble dehiscedone more time. This time she went to the local wound center and Dr. Tanda Rockers took care of it. Richard packet for her. A close securely by secondary intention. She is very pleased with those results. 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 A detailed review of systems today was otherwise entirely negative and in particular she is having no unusual side effects from her letrozole.  PAST MEDICAL HISTORY: Past Medical History  Diagnosis Date  . Breast cancer 07/21/2011  . Breast cancer, left breast 07/21/2011  She is status post bilateral salpingo-oophorectomy without hysterectomy.  She  has a history of dyslipidemia. She has a history of corneal dystrophy.  She has a history of irritable bowel syndrome.  She is status post cholecystectomy, she is status post tonsillectomy and adenoidectomy.  She has a history of significant tobacco abuse, the patient quitting in November of this year after getting the news just  discussed.  The patient has a history of tachycardia, which was evaluated by Dr. Andee Lineman with no significant pathology found.  She has a history of subclinical hypothyroidism, a history of osteopenia, and a possible history of sleep apnea, not study confirmed.  FAMILY HISTORY The patient has no information regarding her father.  Her mother is currently 60 years old.  She has a history of coronary artery disease and emphysema.  The patient has 2 brothers and 1 sister.  There is no history of ovarian, breast, or any other cancer in the immediate family.   GYNECOLOGIC HISTORY: She is GX, P1, first pregnancy age 32.  After having her BSO (which the patient insisted on for "prevention") she took hormone replacement until November 2011   SOCIAL HISTORY: She is retired from [employer], currently works at a [employer] part time.  Her husband, [name], is retired.  Their son, Clide Cliff, lives in [place], is disabled after some workplace injury and has had, the patient says, approximately a dozen back surgeries.  The patient has a grandson who lives in [place] and a 52-year-old great-grandson.  The patient is not a church attender.   ADVANCED DIRECTIVES: not in place  HEALTH MAINTENANCE: History  Substance Use Topics  . Smoking status: Current Every Day Smoker -- 1.00 packs/day for 50 years    Types: Cigarettes  . Smokeless tobacco: Never Used  . Alcohol Use:      Colonoscopy:  PAP:  Bone density: due 2014  Lipid panel:  Allergies  Allergen Reactions  . Pregabalin Other (See Comments)    "Hallucinations"    Current Outpatient Prescriptions  Medication Sig Dispense Refill  . imipramine (TOFRANIL) 25 MG tablet Take 25 mg by mouth at bedtime.        Marland Kitchen letrozole (FEMARA) 2.5 MG tablet Take 1 tablet (2.5 mg total) by mouth daily.  90 tablet  12  . levothyroxine (SYNTHROID, LEVOTHROID) 88 MCG tablet Take 88 mcg by mouth daily.        . megestrol (MEGACE) 40 MG tablet TAKE 1 TABLET DAILY  90 tablet  2  .  metoprolol (TOPROL-XL) 50 MG 24 hr tablet Take 50 mg by mouth daily.        . metronidazole (NORITATE) 1 % cream Apply 1 application topically daily.        . polycarbophil (FIBERCON) 625 MG tablet Take 625 mg by mouth daily.        . prednisoLONE acetate (PRED MILD) 0.12 % ophthalmic suspension Place 1 drop into both eyes. Monday Wednesday and Friday      . simvastatin (ZOCOR) 40 MG tablet Take 40 mg by mouth at bedtime.         No current facility-administered medications for this visit.    OBJECTIVE: Middle-aged white woman who appears well Filed Vitals:   01/17/13 1352  BP: 119/75  Pulse: 80  Temp: 98.2 F (36.8 C)  Resp: 20     Body mass index is 30.57 kg/(m^2).    ECOG FS: 0  Sclerae unicteric Oropharynx clear No cervical or supraclavicular adenopathy Lungs no rales or rhonchi Heart regular rate and rhythm Abd benign MSK no  focal spinal tenderness, no peripheral edema Neuro: nonfocal, well oriented, pleasant affect Breasts: Status post bilateral mastectomies. No evidence of local recurrence. Both axillae are benign  LAB RESULTS: Lab Results  Component Value Date   WBC 5.8 01/10/2013   NEUTROABS 3.2 01/10/2013   HGB 14.1 01/10/2013   HCT 41.0 01/10/2013   MCV 90.1 01/10/2013   PLT 193 01/10/2013      Chemistry      Component Value Date/Time   NA 142 01/10/2013 0905   NA 142 01/09/2012 1050   K 4.3 01/10/2013 0905   K 4.0 01/09/2012 1050   CL 107 01/10/2013 0905   CL 107 01/09/2012 1050   CO2 24 01/10/2013 0905   CO2 26 01/09/2012 1050   BUN 8.2 01/10/2013 0905   BUN 8 01/09/2012 1050   CREATININE 1.1 01/10/2013 0905   CREATININE 0.95 01/09/2012 1050      Component Value Date/Time   CALCIUM 9.0 01/10/2013 0905   CALCIUM 9.1 01/09/2012 1050   ALKPHOS 99 01/10/2013 0905   ALKPHOS 88 01/09/2012 1050   AST 14 01/10/2013 0905   AST 18 01/09/2012 1050   ALT 9 01/10/2013 0905   ALT 12 01/09/2012 1050   BILITOT 0.74 01/10/2013 0905   BILITOT 0.4 01/09/2012 1050       Lab Results  Component Value Date    LABCA2 15 01/09/2012    No components found with this basename: LABCA125    No results found for this basename: INR,  in the last 168 hours  Urinalysis No results found for this basename: colorurine,  appearanceur,  labspec,  phurine,  glucoseu,  hgbur,  bilirubinur,  ketonesur,  proteinur,  urobilinogen,  nitrite,  leukocytesur    STUDIES: No results found.   ASSESSMENT: 67 y.o.  [place] woman, status post bilateral mastectomies, November 2009, for a right-sided T1B N0 and a left-sided T1C N0 invasive ductal carcinomas, both stage IA, both estrogen and progesterone receptor positive, both with a low MIB-1 and both HER2 negative.  She started tamoxifen in December 2009 and we switched her to letrozole in January of 2012.   PLAN: She will complete 5 years of antiestrogen therapy this December and I am comfortable with her "graduating" at this point. She understands that patients who only take tamoxifen for the first 5 years do better if they take an additional 5 years of tamoxifen part, but the combination of 2 years of tamoxifen +3 years of letrozole is optimal and in terms of risk of recurrence achieves the same percentage reduction as taking tamoxifen for 10 years.  Accordingly she will stop the letrozole in December. If she wanted to take anything she could consider Evista/raloxifene, but this would be for bone health and not for breast reasons. All she will need is a yearly chest exam by a physician and she tells me Dr. Reuel Boom doses every visit, which is fantastic.  Pap no skin call for any problems that she thinks may be related to her history of breast cancer, but as of now we have made no further appointments for her here.  MAGRINAT,GUSTAV C    01/17/2013

## 2013-01-25 ENCOUNTER — Other Ambulatory Visit: Payer: Self-pay | Admitting: Oncology

## 2013-02-12 ENCOUNTER — Other Ambulatory Visit: Payer: Self-pay | Admitting: *Deleted

## 2013-02-12 DIAGNOSIS — C50911 Malignant neoplasm of unspecified site of right female breast: Secondary | ICD-10-CM

## 2013-02-12 DIAGNOSIS — C50912 Malignant neoplasm of unspecified site of left female breast: Secondary | ICD-10-CM

## 2013-02-12 MED ORDER — LETROZOLE 2.5 MG PO TABS: 2.5000 mg | ORAL_TABLET | Freq: Every day | ORAL | Status: DC

## 2013-02-19 ENCOUNTER — Other Ambulatory Visit: Payer: Self-pay | Admitting: *Deleted

## 2013-02-19 DIAGNOSIS — C50911 Malignant neoplasm of unspecified site of right female breast: Secondary | ICD-10-CM

## 2013-02-19 DIAGNOSIS — C50912 Malignant neoplasm of unspecified site of left female breast: Secondary | ICD-10-CM

## 2013-02-19 MED ORDER — LETROZOLE 2.5 MG PO TABS: 2.5000 mg | ORAL_TABLET | Freq: Every day | ORAL | Status: DC

## 2013-04-15 ENCOUNTER — Emergency Department (HOSPITAL_COMMUNITY)
Admission: EM | Admit: 2013-04-15 | Discharge: 2013-04-15 | Disposition: A | Discharge status: Home / Self Care | Payer: Medicare Other | Attending: Emergency Medicine | Admitting: Emergency Medicine

## 2013-04-15 ENCOUNTER — Encounter (HOSPITAL_COMMUNITY): Payer: Self-pay

## 2013-04-15 ENCOUNTER — Emergency Department (HOSPITAL_COMMUNITY): Payer: Medicare Other

## 2013-04-15 DIAGNOSIS — N200 Calculus of kidney: Secondary | ICD-10-CM | POA: Insufficient documentation

## 2013-04-15 DIAGNOSIS — Z79899 Other long term (current) drug therapy: Secondary | ICD-10-CM | POA: Insufficient documentation

## 2013-04-15 DIAGNOSIS — Z853 Personal history of malignant neoplasm of breast: Secondary | ICD-10-CM | POA: Insufficient documentation

## 2013-04-15 DIAGNOSIS — R109 Unspecified abdominal pain: Secondary | ICD-10-CM | POA: Insufficient documentation

## 2013-04-15 DIAGNOSIS — F17200 Nicotine dependence, unspecified, uncomplicated: Secondary | ICD-10-CM | POA: Insufficient documentation

## 2013-04-15 DIAGNOSIS — I10 Essential (primary) hypertension: Secondary | ICD-10-CM | POA: Insufficient documentation

## 2013-04-15 DIAGNOSIS — E7800 Pure hypercholesterolemia, unspecified: Secondary | ICD-10-CM | POA: Insufficient documentation

## 2013-04-15 DIAGNOSIS — R300 Dysuria: Secondary | ICD-10-CM | POA: Insufficient documentation

## 2013-04-15 HISTORY — DX: Essential (primary) hypertension: I10

## 2013-04-15 HISTORY — DX: Pure hypercholesterolemia, unspecified: E78.00

## 2013-04-15 HISTORY — DX: Malignant neoplasm of unspecified site of unspecified female breast: C50.919

## 2013-04-15 LAB — COMPREHENSIVE METABOLIC PANEL
AST: 12 U/L (ref 0–37)
Albumin: 3.4 g/dL — ABNORMAL LOW (ref 3.5–5.2)
Alkaline Phosphatase: 98 U/L (ref 39–117)
CO2: 24 mEq/L (ref 19–32)
Chloride: 104 mEq/L (ref 96–112)
Creatinine, Ser: 1.16 mg/dL — ABNORMAL HIGH (ref 0.50–1.10)
GFR calc non Af Amer: 47 mL/min — ABNORMAL LOW (ref >90)
Potassium: 3.2 mEq/L — ABNORMAL LOW (ref 3.5–5.1)
Total Bilirubin: 0.3 mg/dL (ref 0.3–1.2)

## 2013-04-15 LAB — URINALYSIS, ROUTINE W REFLEX MICROSCOPIC
Bilirubin Urine: NEGATIVE
Ketones, ur: NEGATIVE mg/dL
Nitrite: NEGATIVE
Protein, ur: 30 mg/dL — AB
Urobilinogen, UA: 1 mg/dL (ref 0.0–1.0)

## 2013-04-15 LAB — CBC WITH DIFFERENTIAL/PLATELET
Basophils Absolute: 0.1 10*3/uL (ref 0.0–0.1)
HCT: 42.7 % (ref 36.0–46.0)
Hemoglobin: 14.4 g/dL (ref 12.0–15.0)
Lymphocytes Relative: 15 % (ref 12–46)
Monocytes Absolute: 1.5 10*3/uL — ABNORMAL HIGH (ref 0.1–1.0)
Monocytes Relative: 9 % (ref 3–12)
Neutro Abs: 12.1 10*3/uL — ABNORMAL HIGH (ref 1.7–7.7)
Neutrophils Relative %: 74 % (ref 43–77)
RDW: 12.6 % (ref 11.5–15.5)
WBC: 16.3 10*3/uL — ABNORMAL HIGH (ref 4.0–10.5)

## 2013-04-15 LAB — URINE MICROSCOPIC-ADD ON

## 2013-04-15 MED ORDER — ONDANSETRON HCL 4 MG/2ML IJ SOLN
4.0000 mg | Freq: Once | INTRAMUSCULAR | Status: AC
Start: 2013-04-15 — End: 2013-04-15
  Administered 2013-04-15: 4 mg via INTRAVENOUS
  Filled 2013-04-15: qty 2

## 2013-04-15 MED ORDER — KETOROLAC TROMETHAMINE 30 MG/ML IJ SOLN
30.0000 mg | Freq: Once | INTRAMUSCULAR | Status: AC
  Administered 2013-04-15: 30 mg via INTRAVENOUS
  Filled 2013-04-15: qty 1

## 2013-04-15 MED ORDER — IOHEXOL 300 MG/ML  SOLN
100.0000 mL | Freq: Once | INTRAMUSCULAR | Status: AC | PRN
  Administered 2013-04-15: 100 mL via INTRAVENOUS

## 2013-04-15 MED ORDER — SODIUM CHLORIDE 0.9 % IV BOLUS (SEPSIS)
1000.0000 mL | Freq: Once | INTRAVENOUS | Status: AC
  Administered 2013-04-15: 1000 mL via INTRAVENOUS

## 2013-04-15 MED ORDER — HYDROMORPHONE HCL PF 1 MG/ML IJ SOLN
1.0000 mg | Freq: Once | INTRAMUSCULAR | Status: AC
  Administered 2013-04-15: 1 mg via INTRAVENOUS
  Filled 2013-04-15: qty 1

## 2013-04-15 MED ORDER — OXYCODONE-ACETAMINOPHEN 5-325 MG PO TABS: 1.0000 | ORAL_TABLET | ORAL | Status: DC | PRN

## 2013-04-15 MED ORDER — TAMSULOSIN HCL 0.4 MG PO CAPS: 0.4000 mg | ORAL_CAPSULE | Freq: Two times a day (BID) | ORAL | Status: DC

## 2013-04-15 MED ORDER — IOHEXOL 300 MG/ML  SOLN
50.0000 mL | Freq: Once | INTRAMUSCULAR | Status: AC | PRN
  Administered 2013-04-15: 50 mL via ORAL

## 2013-04-15 MED ORDER — NAPROXEN 500 MG PO TABS: 500.0000 mg | ORAL_TABLET | Freq: Two times a day (BID) | ORAL | Status: DC

## 2013-04-15 MED ORDER — ONDANSETRON 4 MG PO TBDP: 4.0000 mg | ORAL_TABLET | Freq: Three times a day (TID) | ORAL | Status: DC | PRN

## 2013-04-15 NOTE — ED Notes (Signed)
Pt complains of urinary retention since last night

## 2013-04-15 NOTE — ED Provider Notes (Signed)
CSN: [id number]     Arrival date & time 04/15/13  0319 History     First MD Initiated Contact with Patient 04/15/13 0335     Chief Complaint  Patient presents with  . Urinary Retention   (Consider location/radiation/quality/duration/timing/severity/associated sxs/prior Treatment) HPI Comments: 67 year old female who presents with a complaint of inability to urinate. The patient does have a history of breast cancer and hypertension. She states that at 11:00 this evening was the last time that she urinated. She did have mild amount of dysuria but since that time she has not been able to urinate and has increasing pain and fullness in her abdomen in the suprapubic region as well as the left lower quadrant. She does note feeling sick to her stomach this evening and having multiple bowel movements which were formed, nonbloody, no watery diarrhea. She has no history of urinary obstructions or retention. She has not had fevers chills coughing shortness of breath or swelling of her legs.  The history is provided by the patient and the spouse.    Past Medical History  Diagnosis Date  . Breast cancer 07/21/2011  . Breast cancer, left breast 07/21/2011  . Hypertension   . Breast CA   . High cholesterol    History reviewed. No pertinent past surgical history. History reviewed. No pertinent family history. History  Substance Use Topics  . Smoking status: Current Every Day Smoker -- 1.00 packs/day for 50 years    Types: Cigarettes  . Smokeless tobacco: Never Used  . Alcohol Use: Not on file   OB History   Grav Para Term Preterm Abortions TAB SAB Ect Mult Living                 Review of Systems  All other systems reviewed and are negative.    Allergies  Pregabalin  Home Medications   Current Outpatient Rx  Name  Route  Sig  Dispense  Refill  . aspirin EC 81 MG tablet   Oral   Take 81 mg by mouth daily.         Marland Kitchen imipramine (TOFRANIL) 25 MG tablet   Oral   Take 25 mg by  mouth at bedtime.           Marland Kitchen letrozole (FEMARA) 2.5 MG tablet   Oral   Take 1 tablet (2.5 mg total) by mouth daily.   90 tablet   1   . levothyroxine (SYNTHROID, LEVOTHROID) 88 MCG tablet   Oral   Take 88 mcg by mouth daily.           . megestrol (MEGACE) 40 MG tablet      TAKE 1 TABLET DAILY   90 tablet   2   . metoprolol (TOPROL-XL) 50 MG 24 hr tablet   Oral   Take 50 mg by mouth daily.           . polycarbophil (FIBERCON) 625 MG tablet   Oral   Take 625 mg by mouth daily.           . prednisoLONE acetate (PRED MILD) 0.12 % ophthalmic suspension   Both Eyes   Place 1 drop into both eyes. Monday and Friday         . simvastatin (ZOCOR) 40 MG tablet   Oral   Take 20 mg by mouth at bedtime.          . sulfamethoxazole-trimethoprim (BACTRIM DS,SEPTRA DS) 800-160 MG per tablet   Oral   Take 1  tablet by mouth 4 (four) times daily. 3 day therapy course patient completed.         . naproxen (NAPROSYN) 500 MG tablet   Oral   Take 1 tablet (500 mg total) by mouth 2 (two) times daily with a meal.   30 tablet   0   . ondansetron (ZOFRAN ODT) 4 MG disintegrating tablet   Oral   Take 1 tablet (4 mg total) by mouth every 8 (eight) hours as needed for nausea.   10 tablet   0   . oxyCODONE-acetaminophen (PERCOCET) 5-325 MG per tablet   Oral   Take 1 tablet by mouth every 4 (four) hours as needed for pain.   20 tablet   0   . tamsulosin (FLOMAX) 0.4 MG CAPS capsule   Oral   Take 1 capsule (0.4 mg total) by mouth 2 (two) times daily.   10 capsule   0    BP 151/95  Pulse 106  Resp 18  SpO2 100% Physical Exam  Nursing note and vitals reviewed. Constitutional: She appears well-developed and well-nourished. No distress.  HENT:  Head: Normocephalic and atraumatic.  Mouth/Throat: Oropharynx is clear and moist. No oropharyngeal exudate.  Eyes: Conjunctivae and EOM are normal. Pupils are equal, round, and reactive to light. Right eye exhibits no  discharge. Left eye exhibits no discharge. No scleral icterus.  Neck: Normal range of motion. Neck supple. No JVD present. No thyromegaly present.  Cardiovascular: Normal rate, regular rhythm, normal heart sounds and intact distal pulses.  Exam reveals no gallop and no friction rub.   No murmur heard. Pulmonary/Chest: Effort normal and breath sounds normal. No respiratory distress. She has no wheezes. She has no rales.  Abdominal: Soft. Bowel sounds are normal. She exhibits no distension and no mass. There is tenderness (mild tenderness to palpation over the suprapubic area, no distinct fullness or masses palpated).  Musculoskeletal: Normal range of motion. She exhibits no edema and no tenderness.  Lymphadenopathy:    She has no cervical adenopathy.  Neurological: She is alert. Coordination normal.  Skin: Skin is warm and dry. No rash noted. No erythema.  Psychiatric: She has a normal mood and affect. Her behavior is normal.    ED Course   Procedures (including critical care time)  Labs Reviewed  URINALYSIS, ROUTINE W REFLEX MICROSCOPIC - Abnormal; Notable for the following:    APPearance TURBID (*)    Specific Gravity, Urine 1.031 (*)    Hgb urine dipstick SMALL (*)    Protein, ur 30 (*)    All other components within normal limits  CBC WITH DIFFERENTIAL - Abnormal; Notable for the following:    WBC 16.3 (*)    Neutro Abs 12.1 (*)    Monocytes Absolute 1.5 (*)    All other components within normal limits  COMPREHENSIVE METABOLIC PANEL - Abnormal; Notable for the following:    Potassium 3.2 (*)    Glucose, Bld 127 (*)    Creatinine, Ser 1.16 (*)    Albumin 3.4 (*)    GFR calc non Af Amer 47 (*)    GFR calc Af Amer 55 (*)    All other components within normal limits  URINE MICROSCOPIC-ADD ON - Abnormal; Notable for the following:    Squamous Epithelial / LPF MANY (*)    All other components within normal limits  URINE CULTURE   Ct Abdomen Pelvis W Contrast  04/15/2013    *RADIOLOGY REPORT*  Clinical Data: Left lower quadrant pain.  Evaluate for diverticulitis or appendicitis.  CT ABDOMEN AND PELVIS WITH CONTRAST  Technique:  Multidetector CT imaging of the abdomen and pelvis was performed following the standard protocol during bolus administration of intravenous contrast.  Contrast: OMNIPAQUE IOHEXOL 300 MG/ML  SOLN  Comparison: None.  Findings:  BODY WALL: Unremarkable.  LOWER CHEST:  Mediastinum: Unremarkable.  Lungs/pleura: Clustering of small nodules in the medial basal segment right lower lobe, associated with probable bronchial mucus plugging.  ABDOMEN/PELVIS:  Liver: No suspicious abnormality.  Benign appearing 15 mm cystic structure along the gallbladder fossa.  Biliary: Mild biliary dilatation, likely reservoir suspect status post cholecystectomy.  Pancreas: Unremarkable.  Spleen: Unremarkable.  Adrenals: Unremarkable.  Kidneys and ureters: There is moderate left hydroureteronephrosis secondary to a 3 mm left ureterovesicular junction calculus. Asymmetric left perinephric edema and delayed excretion, consistent with obstruction.  Bladder: Unremarkable.  Bowel: No obstruction.  Extensive distal colonic diverticulosis. Mild colonic wall thickening can be accounted for by underdistension, especially as there is no pericolonic stranding. Normal appemmndix.  Retroperitoneum: No mass or adenopathy.  Peritoneum: No free fluid or gas.  Reproductive: Unremarkable.  Vascular: No acute abnormality.  OSSEOUS: No acute abnormalities. Although large at 2 cm, sclerotic focus in the right L4 body is most likely a giant bone island based on the margins and homogenous density.  IMPRESSION:  1.  3-mm left ureterovesicular junction calculus with proximal obstructive uropathy. 2.  Colonic diverticulosis. 3. Small nonspecific opacity at the medial right lung base could represent mild pneumonitis or early infection.  4. 2-cm sclerotic lesion in the right L4 body, appearance favoring bone  island.   Original Report Authenticated By: Tiburcio Pea   1. Kidney stone on left side     MDM  Though the patient appears mildly uncomfortable she has relatively normal vital signs, there is no tachycardia on my exam, no hypotension and she has focal findings on her abdominal exam that suggest urinary retention. We'll place a Foley catheter, then reevaluate.  CT scan confirms that the patient has a ureterovesical kidney stone of 3 mm in size with some urinary obstruction on the left. She has no signs of urinary infection, she has had good improvement with the pain medications given, see below. I discussed results with her and her husband, they will followup with the urologist as indicated but she appears stable for discharge at this time.  Meds given in ED:  Medications  sodium chloride 0.9 % bolus 1,000 mL (0 mLs Intravenous Stopped 04/15/13 0608)  HYDROmorphone (DILAUDID) injection 1 mg (1 mg Intravenous Given 04/15/13 0434)  ondansetron (ZOFRAN) injection 4 mg (4 mg Intravenous Given 04/15/13 0434)  iohexol (OMNIPAQUE) 300 MG/ML solution 50 mL (50 mLs Oral Contrast Given 04/15/13 0444)  iohexol (OMNIPAQUE) 300 MG/ML solution 100 mL (100 mLs Intravenous Contrast Given 04/15/13 0608)  ketorolac (TORADOL) 30 MG/ML injection 30 mg (30 mg Intravenous Given 04/15/13 0657)    New Prescriptions   NAPROXEN (NAPROSYN) 500 MG TABLET    Take 1 tablet (500 mg total) by mouth 2 (two) times daily with a meal.   ONDANSETRON (ZOFRAN ODT) 4 MG DISINTEGRATING TABLET    Take 1 tablet (4 mg total) by mouth every 8 (eight) hours as needed for nausea.   OXYCODONE-ACETAMINOPHEN (PERCOCET) 5-325 MG PER TABLET    Take 1 tablet by mouth every 4 (four) hours as needed for pain.   TAMSULOSIN (FLOMAX) 0.4 MG CAPS CAPSULE    Take 1 capsule (0.4 mg total) by mouth 2 (  two) times daily.      Vida Roller, MD 04/15/13 [phone]

## 2013-04-16 LAB — URINE CULTURE

## 2015-01-02 ENCOUNTER — Other Ambulatory Visit: Payer: Self-pay | Admitting: Physician Assistant

## 2016-03-10 ENCOUNTER — Other Ambulatory Visit: Payer: Self-pay

## 2016-05-05 ENCOUNTER — Other Ambulatory Visit: Payer: Self-pay | Admitting: Oncology

## 2016-05-06 ENCOUNTER — Other Ambulatory Visit: Payer: Self-pay | Admitting: Oncology

## 2016-05-06 ENCOUNTER — Ambulatory Visit (HOSPITAL_BASED_OUTPATIENT_CLINIC_OR_DEPARTMENT_OTHER): Payer: Medicare Other

## 2016-05-06 ENCOUNTER — Telehealth: Payer: Self-pay | Admitting: Oncology

## 2016-05-06 ENCOUNTER — Other Ambulatory Visit: Payer: Self-pay | Admitting: *Deleted

## 2016-05-06 DIAGNOSIS — C50912 Malignant neoplasm of unspecified site of left female breast: Secondary | ICD-10-CM

## 2016-05-06 DIAGNOSIS — C50911 Malignant neoplasm of unspecified site of right female breast: Secondary | ICD-10-CM

## 2016-05-06 DIAGNOSIS — D759 Disease of blood and blood-forming organs, unspecified: Secondary | ICD-10-CM

## 2016-05-06 LAB — CBC & DIFF AND RETIC
BASO%: 1.1 % (ref 0.0–2.0)
BASOS ABS: 0 10*3/uL (ref 0.0–0.1)
EOS%: 3.7 % (ref 0.0–7.0)
Eosinophils Absolute: 0 10*3/uL (ref 0.0–0.5)
HCT: 40.7 % (ref 34.8–46.6)
HEMOGLOBIN: 13.2 g/dL (ref 11.6–15.9)
Immature Retic Fract: 4.4 % (ref 1.60–10.00)
LYMPH#: 1 10*3/uL (ref 0.9–3.3)
LYMPH%: 75.6 % — ABNORMAL HIGH (ref 14.0–49.7)
MCH: 30.1 pg (ref 25.1–34.0)
MCHC: 32.4 g/dL (ref 31.5–36.0)
MCV: 92.9 fL (ref 79.5–101.0)
MONO#: 0.1 10*3/uL (ref 0.1–0.9)
MONO%: 8.4 % (ref 0.0–14.0)
NEUT#: 0.1 10*3/uL — CL (ref 1.5–6.5)
NEUT%: 11.2 % — AB (ref 38.4–76.8)
NRBC: 0 % (ref 0–0)
Platelets: 139 10*3/uL — ABNORMAL LOW (ref 145–400)
RBC: 4.38 10*6/uL (ref 3.70–5.45)
RDW: 13.8 % (ref 11.2–14.5)
RETIC %: 0.82 % (ref 0.70–2.10)
RETIC CT ABS: 35.92 10*3/uL (ref 33.70–90.70)
WBC: 1.3 10*3/uL — ABNORMAL LOW (ref 3.9–10.3)

## 2016-05-06 LAB — COMPREHENSIVE METABOLIC PANEL
ALT: 10 U/L (ref 0–55)
AST: 15 U/L (ref 5–34)
Albumin: 3.6 g/dL (ref 3.5–5.0)
Alkaline Phosphatase: 125 U/L (ref 40–150)
Anion Gap: 9 mEq/L (ref 3–11)
BUN: 6.8 mg/dL — AB (ref 7.0–26.0)
CO2: 26 meq/L (ref 22–29)
Calcium: 9.1 mg/dL (ref 8.4–10.4)
Chloride: 107 mEq/L (ref 98–109)
Creatinine: 0.8 mg/dL (ref 0.6–1.1)
EGFR: 71 mL/min/{1.73_m2} — AB (ref >90)
GLUCOSE: 91 mg/dL (ref 70–140)
POTASSIUM: 4.1 meq/L (ref 3.5–5.1)
SODIUM: 142 meq/L (ref 136–145)
Total Bilirubin: 0.58 mg/dL (ref 0.20–1.20)
Total Protein: 6.6 g/dL (ref 6.4–8.3)

## 2016-05-06 LAB — LACTATE DEHYDROGENASE: LDH: 123 U/L — ABNORMAL LOW (ref 125–245)

## 2016-05-06 LAB — FERRITIN: Ferritin: 72 ng/ml (ref 9–269)

## 2016-05-06 LAB — TECHNOLOGIST REVIEW

## 2016-05-06 LAB — CHCC SMEAR

## 2016-05-06 NOTE — Progress Notes (Signed)
I Kilpatrick with the results of her smear review. This is nondiagnostic. There are essentially no changes of concern in the white cells or platelets. This is very unlikely to be an acute leukemia but there is a marked leukopenia and neutropenia with no apparent cause and there are some unusual-looking cells which might possibly be blasts circulating. She is going to need a bone marrow biopsy and I called her to tell her that. She will also see me next week to discuss this further.

## 2016-05-06 NOTE — Telephone Encounter (Signed)
Spoke with pt to confirm 9/6 appt date/time per staff message

## 2016-05-07 LAB — VITAMIN B12: Vitamin B12: 242 pg/mL (ref 211–946)

## 2016-05-07 LAB — SEDIMENTATION RATE: Sedimentation Rate-Westergren: 6 mm/hr (ref 0–40)

## 2016-05-09 LAB — FOLATE RBC
FOLATE, HEMOLYSATE: 399.6 ng/mL
Folate, RBC: 1030 ng/mL (ref >498)
Hematocrit: 38.8 % (ref 34.0–46.6)

## 2016-05-10 ENCOUNTER — Other Ambulatory Visit: Payer: Self-pay | Admitting: Oncology

## 2016-05-11 ENCOUNTER — Ambulatory Visit (HOSPITAL_BASED_OUTPATIENT_CLINIC_OR_DEPARTMENT_OTHER): Payer: Medicare Other | Admitting: Oncology

## 2016-05-11 DIAGNOSIS — Z853 Personal history of malignant neoplasm of breast: Secondary | ICD-10-CM | POA: Diagnosis not present

## 2016-05-11 DIAGNOSIS — D696 Thrombocytopenia, unspecified: Secondary | ICD-10-CM | POA: Diagnosis not present

## 2016-05-11 DIAGNOSIS — D72819 Decreased white blood cell count, unspecified: Secondary | ICD-10-CM

## 2016-05-11 DIAGNOSIS — D708 Other neutropenia: Secondary | ICD-10-CM

## 2016-05-11 DIAGNOSIS — D709 Neutropenia, unspecified: Secondary | ICD-10-CM | POA: Insufficient documentation

## 2016-05-11 LAB — ANTINUCLEAR ANTIBODIES, IFA: ANTINUCLEAR ANTIBODIES, IFA: POSITIVE — AB

## 2016-05-11 NOTE — Progress Notes (Signed)
[place]  [phone] [phone]     ID: JOELYS STAUBS DOB: 1945-12-12  MR#: [id number]  TKP#:[id number]  Patient Care Team: Caryl Bis, MD as PCP - General (Unknown Physician Specialty) PCP: Gar Ponto, MD Chauncey Cruel, MD OTHER MD:  CHIEF COMPLAINT: Neutropenia  CURRENT TREATMENT: Bone marrow biopsy pending   HISTORY OF PRESENT ILLNESS: Was in her usual state of health when in course of a routine physical exam was found to be slightly leukopenic. Her primary care physician repeated her counts and they were even lower. Accordingly she was referred here for further evaluation and on 05/06/2016 we obtain a CBC and differential with other labs. The white cell count was 1.3. The neutrophil count was 0.1. The hemoglobin was 13.2. The MCV was 92.9. The platelet count was 139,000. Review of the blood film showed no significant abnormalities in the red cell series. Specifically there were no detail poikilocytes or nucleated red cells. The platelets appear unremarkable. In the white cell series there was no clear left shift, and no definite blasts. There were some atypical mononuclear cell some of which were consistent with large granular lymphocytes.  Other labs obtained the same day included a ferritin of 72, a vitamin B12 at 242, and RBC folate of 1030, a sedimentation rate of 6, and LDH of 123, and an ANA that was positive at 1:80.  Her subsequent history is as detailed below   INTERVAL HISTORY: Fraser Din was evaluated in the hematology clinic 05/11/2016 accompanied by her husband Richard  REVIEW OF SYSTEMS: She denies unexplained fatigue or unexplained weight loss. There has been no rash, fever, or adenopathy. There have been no intercurrent infections. She has had no change in her functional status and is enjoying taking care of her 71-year-old great grandson. Overall she is well. A detailed review of systems today was otherwise  noncontributory  PAST MEDICAL HISTORY: Past Medical History:  Diagnosis Date  . Breast CA   . Breast cancer 07/21/2011  . Breast cancer, left breast 07/21/2011  . High cholesterol   . Hypertension     PAST SURGICAL HISTORY: No past surgical history on file. She status post left lumpectomy remotely, with bilateral implant reconstruction, subsequently the implant being "rejected". She status post cholecystectomy, and status post bilateral salpingo-oophorectomy without hysterectomy.  FAMILY HISTORY No family history on file. The patient has no information about her father. Her mother died at the age of 75. She has 2 half-brothers and 1 half-sister. There is no history of cancer in the family and specifically no history of hematologic problems that she is aware of   GYNECOLOGIC HISTORY:  No LMP recorded. Patient is postmenopausal. Menarche age 80, first live birth age 52, she is [place] P1. She stopped having periods at 89 and took hormone replacement approximately 8 years.  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 Her husband of more than 54 years, Delfino Lovett, used to work for [employer], but also is now retired. Their son Audry Pili is disabled secondary to multiple back surgeries. He lives in evening. The patient has one son and a 53-year-old great grandson. She is not a church attender although she is of the [place]: Social History  Substance Use Topics  . Smoking status: Current Every Day Smoker    Packs/day: 1.00    Years: 50.00    Types: Cigarettes  . Smokeless tobacco: Never Used  .  Alcohol use Not on file     Colonoscopy:  PAP:  Bone density:   Allergies  Allergen Reactions  . Pregabalin Other (See Comments)    "Hallucinations"    Current Outpatient Prescriptions  Medication Sig Dispense Refill  . aspirin EC 81 MG tablet Take 81 mg by mouth daily.    Marland Kitchen imipramine (TOFRANIL) 25 MG tablet Take 25 mg  by mouth at bedtime.      Marland Kitchen letrozole (FEMARA) 2.5 MG tablet Take 1 tablet (2.5 mg total) by mouth daily. 90 tablet 1  . levothyroxine (SYNTHROID, LEVOTHROID) 88 MCG tablet Take 88 mcg by mouth daily.      . megestrol (MEGACE) 40 MG tablet TAKE 1 TABLET DAILY 90 tablet 2  . metoprolol (TOPROL-XL) 50 MG 24 hr tablet Take 50 mg by mouth daily.      . naproxen (NAPROSYN) 500 MG tablet Take 1 tablet (500 mg total) by mouth 2 (two) times daily with a meal. 30 tablet 0  . ondansetron (ZOFRAN ODT) 4 MG disintegrating tablet Take 1 tablet (4 mg total) by mouth every 8 (eight) hours as needed for nausea. 10 tablet 0  . oxyCODONE-acetaminophen (PERCOCET) 5-325 MG per tablet Take 1 tablet by mouth every 4 (four) hours as needed for pain. 20 tablet 0  . polycarbophil (FIBERCON) 625 MG tablet Take 625 mg by mouth daily.      . prednisoLONE acetate (PRED MILD) 0.12 % ophthalmic suspension Place 1 drop into both eyes. Monday and Friday    . simvastatin (ZOCOR) 40 MG tablet Take 20 mg by mouth at bedtime.     . sulfamethoxazole-trimethoprim (BACTRIM DS,SEPTRA DS) 800-160 MG per tablet Take 1 tablet by mouth 4 (four) times daily. 3 day therapy course patient completed.    . tamsulosin (FLOMAX) 0.4 MG CAPS capsule Take 1 capsule (0.4 mg total) by mouth 2 (two) times daily. 10 capsule 0   No current facility-administered medications for this visit.     OBJECTIVE: Middle-aged white woman who appears older than stated age 70:   05/11/16 0812  BP: 133/61  Pulse: 76  Resp: 18  Temp: 98.3 F (36.8 C)     Body mass index is 28.55 kg/m.    ECOG FS:0 - Asymptomatic  Ocular: Sclerae unicteric, pupils equal, round and reactive to light Ear-nose-throat: Oropharynx clear and moist Lymphatic: No cervical or supraclavicular adenopathy Lungs no rales or rhonchi, good excursion bilaterally Heart regular rate and rhythm, no murmur appreciated Abd soft, nontender, positive bowel sounds MSK no focal spinal  tenderness, no joint edema Neuro: non-focal, well-oriented, appropriate affect Breasts: Status post bilateral mastectomies. There is no evidence of local recurrence. Both axillae are benign.   LAB RESULTS:  CMP     Component Value Date/Time   NA 142 05/06/2016 1159   K 4.1 05/06/2016 1159   CL 104 04/15/2013 0441   CL 107 01/10/2013 0905   CO2 26 05/06/2016 1159   GLUCOSE 91 05/06/2016 1159   GLUCOSE 101 (H) 01/10/2013 0905   BUN 6.8 (L) 05/06/2016 1159   CREATININE 0.8 05/06/2016 1159   CALCIUM 9.1 05/06/2016 1159   PROT 6.6 05/06/2016 1159   ALBUMIN 3.6 05/06/2016 1159   AST 15 05/06/2016 1159   ALT 10 05/06/2016 1159   ALKPHOS 125 05/06/2016 1159   BILITOT 0.58 05/06/2016 1159   GFRNONAA 47 (L) 04/15/2013 0441   GFRAA 55 (L) 04/15/2013 0441    INo results found for: SPEP, UPEP  Lab Results  Component Value Date   WBC 1.3 (L) 05/06/2016   NEUTROABS 0.1 (LL) 05/06/2016   HGB 13.2 05/06/2016   HCT 38.8 05/06/2016   MCV 92.9 05/06/2016   PLT 139 (L) 05/06/2016      Chemistry      Component Value Date/Time   NA 142 05/06/2016 1159   K 4.1 05/06/2016 1159   CL 104 04/15/2013 0441   CL 107 01/10/2013 0905   CO2 26 05/06/2016 1159   BUN 6.8 (L) 05/06/2016 1159   CREATININE 0.8 05/06/2016 1159      Component Value Date/Time   CALCIUM 9.1 05/06/2016 1159   ALKPHOS 125 05/06/2016 1159   AST 15 05/06/2016 1159   ALT 10 05/06/2016 1159   BILITOT 0.58 05/06/2016 1159       Lab Results  Component Value Date   LABCA2 15 01/09/2012    No components found for: [id number]  No results for input(s): INR in the last 168 hours.  Urinalysis    Component Value Date/Time   COLORURINE YELLOW 04/15/2013 0446   APPEARANCEUR TURBID (A) 04/15/2013 0446   LABSPEC 1.031 (H) 04/15/2013 0446   PHURINE 5.0 04/15/2013 0446   GLUCOSEU NEGATIVE 04/15/2013 0446   HGBUR SMALL (A) 04/15/2013 0446   BILIRUBINUR NEGATIVE 04/15/2013 0446   KETONESUR NEGATIVE 04/15/2013 0446    PROTEINUR 30 (A) 04/15/2013 0446   UROBILINOGEN 1.0 04/15/2013 0446   NITRITE NEGATIVE 04/15/2013 0446   LEUKOCYTESUR NEGATIVE 04/15/2013 0446     STUDIES: No results found.  ELIGIBLE FOR AVAILABLE RESEARCH PROTOCOL: no  ASSESSMENT: 70 y.o. [place] woman with unexplained leukopenia  (1) history of breast cancer, bilateral, status post bilateral mastectomies, status post 5 years of anti-estrogens(tamoxifen for 2 years, letrozole for 3 years) remotely  (2) progressive and severe leukopenia and neutropenia noted August 2017, in the absence of anemia and only mild thrombocytopenia, with review of the blood film nondiagnostic   PLAN: We spent the better part of today's hour-long appointment discussing the biology of blood cells in the different kinds of cytopenias. Specifically Fraser Din understands that all blood cells grow in the marrow of bones. There were 3 basic cell lines. The red cells carry oxygen and she has red cells which are normal in number and normal in morphology. A second line consists of the platelets. These are clotting cells. She has a normal number of platelets.  Her problem is the white cell line. There has been a rapid decrease in her white cells to the current severe neutropenia. On the other hand I don't see clear evidence of circulating blasts, I don't see a lymphocytosis or evidence of hairy cells, and in short review of the blood film is not diagnostic.  We obtained routine B12 folate and ferritin labs as well as an ESR and all of those were normal, as was the LDH. What this means is that we don't see an obvious nutritional deficiency, and that there is no clear evidence of a primary inflammatory condition such as lupus.  She does have a positive ANA though it is low titer (1:80). This goes along with my expectation that what we are dealing with will be an immune reaction against her white cells, and if that is the case we will consider treating her with steroids  initially and follow her response.  Before doing that we need to obtain a bone marrow biopsy. I am expecting that to rule out a leukemia or other primary bone marrow problem since the other 2 cell  lines are normal. If we do document a large granular cell leukemia we will consider adding methotrexate to the initial steroids.  In the absence of infection I do not see a role at present for granulocyte stimulating factors.  I did stress to Fraser Din that at this point her immune system is not functional and if she develops a fever she needs to call immediately and in any case be evaluated in the emergency room for likely admission. She is going to wash your hands after any interaction with anyone but I did not suggest that she simply lock herself up in her home. She just needs to avoid people who are sick or places where she can easily be infected either through direct trauma or other exposure  Fraser Din has a good understanding of this plan. She agrees with it. She knows to call for any problems that may develop before her next visit here, which will be in approximately 2 weeks.Marland Kitchen  Chauncey Cruel, MD   05/11/2016 7:08 PM Medical Oncology and Hematology [facility] [address] [place], [place] [address] Tel. [phone]    Fax. [phone]

## 2016-05-17 ENCOUNTER — Encounter (HOSPITAL_COMMUNITY): Payer: Self-pay

## 2016-05-17 ENCOUNTER — Emergency Department (HOSPITAL_COMMUNITY): Payer: Medicare Other

## 2016-05-17 ENCOUNTER — Emergency Department (HOSPITAL_COMMUNITY)
Admit: 2016-05-17 | Discharge: 2016-05-17 | Disposition: A | Discharge status: Home / Self Care | Payer: Medicare Other | Attending: Emergency Medicine | Admitting: Emergency Medicine

## 2016-05-17 ENCOUNTER — Inpatient Hospital Stay (HOSPITAL_COMMUNITY)
Admission: EM | Admit: 2016-05-17 | Discharge: 2016-05-20 | DRG: 872 | Disposition: A | Discharge status: Home Health | Payer: Medicare Other | Attending: Internal Medicine | Admitting: Internal Medicine

## 2016-05-17 ENCOUNTER — Other Ambulatory Visit: Payer: Self-pay | Admitting: General Surgery

## 2016-05-17 DIAGNOSIS — Y92002 Bathroom of unspecified non-institutional (private) residence single-family (private) house as the place of occurrence of the external cause: Secondary | ICD-10-CM | POA: Diagnosis not present

## 2016-05-17 DIAGNOSIS — D61818 Other pancytopenia: Secondary | ICD-10-CM | POA: Diagnosis present

## 2016-05-17 DIAGNOSIS — D72819 Decreased white blood cell count, unspecified: Secondary | ICD-10-CM | POA: Diagnosis present

## 2016-05-17 DIAGNOSIS — W1830XA Fall on same level, unspecified, initial encounter: Secondary | ICD-10-CM | POA: Diagnosis present

## 2016-05-17 DIAGNOSIS — I809 Phlebitis and thrombophlebitis of unspecified site: Secondary | ICD-10-CM

## 2016-05-17 DIAGNOSIS — I10 Essential (primary) hypertension: Secondary | ICD-10-CM | POA: Diagnosis present

## 2016-05-17 DIAGNOSIS — R55 Syncope and collapse: Secondary | ICD-10-CM | POA: Diagnosis present

## 2016-05-17 DIAGNOSIS — K5090 Crohn's disease, unspecified, without complications: Secondary | ICD-10-CM | POA: Diagnosis present

## 2016-05-17 DIAGNOSIS — Z79899 Other long term (current) drug therapy: Secondary | ICD-10-CM | POA: Diagnosis not present

## 2016-05-17 DIAGNOSIS — S2241XA Multiple fractures of ribs, right side, initial encounter for closed fracture: Secondary | ICD-10-CM | POA: Diagnosis present

## 2016-05-17 DIAGNOSIS — D696 Thrombocytopenia, unspecified: Secondary | ICD-10-CM | POA: Diagnosis present

## 2016-05-17 DIAGNOSIS — C50912 Malignant neoplasm of unspecified site of left female breast: Secondary | ICD-10-CM | POA: Diagnosis present

## 2016-05-17 DIAGNOSIS — E039 Hypothyroidism, unspecified: Secondary | ICD-10-CM | POA: Diagnosis present

## 2016-05-17 DIAGNOSIS — K529 Noninfective gastroenteritis and colitis, unspecified: Secondary | ICD-10-CM

## 2016-05-17 DIAGNOSIS — D709 Neutropenia, unspecified: Secondary | ICD-10-CM | POA: Diagnosis present

## 2016-05-17 DIAGNOSIS — G4700 Insomnia, unspecified: Secondary | ICD-10-CM | POA: Diagnosis present

## 2016-05-17 DIAGNOSIS — D759 Disease of blood and blood-forming organs, unspecified: Secondary | ICD-10-CM

## 2016-05-17 DIAGNOSIS — C50911 Malignant neoplasm of unspecified site of right female breast: Secondary | ICD-10-CM | POA: Diagnosis present

## 2016-05-17 DIAGNOSIS — E7800 Pure hypercholesterolemia, unspecified: Secondary | ICD-10-CM | POA: Diagnosis present

## 2016-05-17 DIAGNOSIS — M79609 Pain in unspecified limb: Secondary | ICD-10-CM | POA: Diagnosis not present

## 2016-05-17 DIAGNOSIS — D708 Other neutropenia: Secondary | ICD-10-CM | POA: Diagnosis not present

## 2016-05-17 DIAGNOSIS — Z888 Allergy status to other drugs, medicaments and biological substances status: Secondary | ICD-10-CM | POA: Diagnosis not present

## 2016-05-17 DIAGNOSIS — A419 Sepsis, unspecified organism: Principal | ICD-10-CM | POA: Diagnosis present

## 2016-05-17 DIAGNOSIS — Z9013 Acquired absence of bilateral breasts and nipples: Secondary | ICD-10-CM

## 2016-05-17 DIAGNOSIS — N390 Urinary tract infection, site not specified: Secondary | ICD-10-CM | POA: Diagnosis present

## 2016-05-17 DIAGNOSIS — R509 Fever, unspecified: Secondary | ICD-10-CM | POA: Diagnosis not present

## 2016-05-17 DIAGNOSIS — S2231XA Fracture of one rib, right side, initial encounter for closed fracture: Secondary | ICD-10-CM

## 2016-05-17 DIAGNOSIS — F17210 Nicotine dependence, cigarettes, uncomplicated: Secondary | ICD-10-CM | POA: Diagnosis present

## 2016-05-17 DIAGNOSIS — Z7409 Other reduced mobility: Secondary | ICD-10-CM

## 2016-05-17 HISTORY — DX: Irritable bowel syndrome without diarrhea: K58.9

## 2016-05-17 LAB — COMPREHENSIVE METABOLIC PANEL
ALBUMIN: 3.9 g/dL (ref 3.5–5.0)
ALT: 13 U/L — ABNORMAL LOW (ref 14–54)
ANION GAP: 9 (ref 5–15)
AST: 18 U/L (ref 15–41)
Alkaline Phosphatase: 102 U/L (ref 38–126)
BUN: 8 mg/dL (ref 6–20)
CO2: 23 mmol/L (ref 22–32)
Calcium: 8.5 mg/dL — ABNORMAL LOW (ref 8.9–10.3)
Chloride: 105 mmol/L (ref 101–111)
Creatinine, Ser: 0.99 mg/dL (ref 0.44–1.00)
GFR calc Af Amer: >60 mL/min (ref >60)
GFR calc non Af Amer: 56 mL/min — ABNORMAL LOW (ref >60)
GLUCOSE: 142 mg/dL — AB (ref 65–99)
POTASSIUM: 3.4 mmol/L — AB (ref 3.5–5.1)
SODIUM: 137 mmol/L (ref 135–145)
TOTAL PROTEIN: 6.8 g/dL (ref 6.5–8.1)
Total Bilirubin: 1.5 mg/dL — ABNORMAL HIGH (ref 0.3–1.2)

## 2016-05-17 LAB — URINALYSIS, ROUTINE W REFLEX MICROSCOPIC
Glucose, UA: 100 mg/dL — AB
KETONES UR: NEGATIVE mg/dL
NITRITE: POSITIVE — AB
PH: 6 (ref 5.0–8.0)
Protein, ur: 100 mg/dL — AB
SPECIFIC GRAVITY, URINE: 1.037 — AB (ref 1.005–1.030)

## 2016-05-17 LAB — CBC WITH DIFFERENTIAL/PLATELET
BASOS ABS: 0 10*3/uL (ref 0.0–0.1)
Basophils Relative: 0 %
EOS ABS: 0 10*3/uL (ref 0.0–0.7)
Eosinophils Relative: 1 %
HCT: 36.1 % (ref 36.0–46.0)
Hemoglobin: 12.4 g/dL (ref 12.0–15.0)
LYMPHS ABS: 0.8 10*3/uL (ref 0.7–4.0)
LYMPHS PCT: 49 %
MCH: 31.3 pg (ref 26.0–34.0)
MCHC: 34.3 g/dL (ref 30.0–36.0)
MCV: 91.2 fL (ref 78.0–100.0)
Monocytes Absolute: 0.3 10*3/uL (ref 0.1–1.0)
Monocytes Relative: 17 %
NEUTROS ABS: 0.5 10*3/uL — AB (ref 1.7–7.7)
Neutrophils Relative %: 33 %
Platelets: 124 10*3/uL — ABNORMAL LOW (ref 150–400)
RBC: 3.96 MIL/uL (ref 3.87–5.11)
RDW: 13.2 % (ref 11.5–15.5)
WBC: 1.6 10*3/uL — ABNORMAL LOW (ref 4.0–10.5)

## 2016-05-17 LAB — I-STAT CHEM 8, ED
BUN: 6 mg/dL (ref 6–20)
CREATININE: 0.9 mg/dL (ref 0.44–1.00)
Calcium, Ion: 1.11 mmol/L — ABNORMAL LOW (ref 1.15–1.40)
Chloride: 102 mmol/L (ref 101–111)
Glucose, Bld: 138 mg/dL — ABNORMAL HIGH (ref 65–99)
HEMATOCRIT: 36 % (ref 36.0–46.0)
HEMOGLOBIN: 12.2 g/dL (ref 12.0–15.0)
POTASSIUM: 3.4 mmol/L — AB (ref 3.5–5.1)
Sodium: 139 mmol/L (ref 135–145)
TCO2: 25 mmol/L (ref 0–100)

## 2016-05-17 LAB — TSH: TSH: 1.839 u[IU]/mL (ref 0.350–4.500)

## 2016-05-17 LAB — URINE MICROSCOPIC-ADD ON

## 2016-05-17 LAB — CK: Total CK: 19 U/L — ABNORMAL LOW (ref 38–234)

## 2016-05-17 LAB — BILIRUBIN, DIRECT: Bilirubin, Direct: 0.3 mg/dL (ref 0.1–0.5)

## 2016-05-17 LAB — CBG MONITORING, ED: Glucose-Capillary: 148 mg/dL — ABNORMAL HIGH (ref 65–99)

## 2016-05-17 LAB — LIPASE, BLOOD: Lipase: 16 U/L (ref 11–51)

## 2016-05-17 MED ORDER — ONDANSETRON HCL 4 MG/2ML IJ SOLN: 4.0000 mg | Freq: Once | INTRAMUSCULAR | Status: AC | PRN

## 2016-05-17 MED ORDER — PANTOPRAZOLE SODIUM 40 MG PO TBEC
40.0000 mg | DELAYED_RELEASE_TABLET | Freq: Every day | ORAL | Status: DC
  Administered 2016-05-18 – 2016-05-20 (×3): 40 mg via ORAL
  Filled 2016-05-17 (×3): qty 1

## 2016-05-17 MED ORDER — FLUOROMETHOLONE 0.1 % OP SUSP: 1.0000 [drp] | OPHTHALMIC | Status: DC

## 2016-05-17 MED ORDER — IOPAMIDOL (ISOVUE-300) INJECTION 61%
30.0000 mL | Freq: Once | INTRAVENOUS | Status: AC | PRN
  Administered 2016-05-17: 30 mL via ORAL

## 2016-05-17 MED ORDER — IOPAMIDOL (ISOVUE-300) INJECTION 61%
100.0000 mL | Freq: Once | INTRAVENOUS | Status: AC | PRN
  Administered 2016-05-17: 100 mL via INTRAVENOUS

## 2016-05-17 MED ORDER — MORPHINE SULFATE (PF) 4 MG/ML IV SOLN: 4.0000 mg | Freq: Once | INTRAVENOUS | Status: DC

## 2016-05-17 MED ORDER — METOPROLOL SUCCINATE ER 50 MG PO TB24
50.0000 mg | ORAL_TABLET | Freq: Every day | ORAL | Status: DC
  Administered 2016-05-18 – 2016-05-20 (×3): 50 mg via ORAL
  Filled 2016-05-17 (×3): qty 1

## 2016-05-17 MED ORDER — IBUPROFEN 200 MG PO TABS: 600.0000 mg | ORAL_TABLET | Freq: Three times a day (TID) | ORAL | Status: DC

## 2016-05-17 MED ORDER — ONDANSETRON HCL 4 MG/2ML IJ SOLN: 4.0000 mg | Freq: Once | INTRAMUSCULAR | Status: DC

## 2016-05-17 MED ORDER — METRONIDAZOLE IN NACL 5-0.79 MG/ML-% IV SOLN
500.0000 mg | Freq: Once | INTRAVENOUS | Status: AC
  Administered 2016-05-17: 500 mg via INTRAVENOUS
  Filled 2016-05-17: qty 100

## 2016-05-17 MED ORDER — ACETAMINOPHEN 325 MG PO TABS
650.0000 mg | ORAL_TABLET | Freq: Four times a day (QID) | ORAL | Status: DC | PRN
  Administered 2016-05-17 – 2016-05-19 (×5): 650 mg via ORAL
  Filled 2016-05-17 (×5): qty 2

## 2016-05-17 MED ORDER — SODIUM CHLORIDE 0.9 % IV SOLN
INTRAVENOUS | Status: DC
  Administered 2016-05-17 – 2016-05-20 (×8): via INTRAVENOUS

## 2016-05-17 MED ORDER — CLONAZEPAM 0.5 MG PO TABS
0.5000 mg | ORAL_TABLET | Freq: Every day | ORAL | Status: DC
  Administered 2016-05-17 – 2016-05-19 (×3): 0.5 mg via ORAL
  Filled 2016-05-17 (×3): qty 1

## 2016-05-17 MED ORDER — MORPHINE SULFATE (PF) 2 MG/ML IV SOLN
2.0000 mg | INTRAVENOUS | Status: DC | PRN
  Administered 2016-05-17 – 2016-05-20 (×13): 2 mg via INTRAVENOUS
  Filled 2016-05-17 (×14): qty 1

## 2016-05-17 MED ORDER — ENOXAPARIN SODIUM 40 MG/0.4ML ~~LOC~~ SOLN
40.0000 mg | SUBCUTANEOUS | Status: DC
  Administered 2016-05-17 – 2016-05-19 (×3): 40 mg via SUBCUTANEOUS
  Filled 2016-05-17 (×3): qty 0.4

## 2016-05-17 MED ORDER — SODIUM CHLORIDE 0.9 % IV SOLN: INTRAVENOUS | Status: DC

## 2016-05-17 MED ORDER — MORPHINE SULFATE (PF) 2 MG/ML IV SOLN
2.0000 mg | INTRAVENOUS | Status: DC | PRN
  Filled 2016-05-17: qty 1

## 2016-05-17 MED ORDER — SODIUM CHLORIDE 0.9 % IV BOLUS (SEPSIS)
1000.0000 mL | Freq: Once | INTRAVENOUS | Status: AC
  Administered 2016-05-17: 1000 mL via INTRAVENOUS

## 2016-05-17 MED ORDER — DEXTROSE 5 % IV SOLN
1.0000 g | Freq: Once | INTRAVENOUS | Status: AC
  Administered 2016-05-17: 1 g via INTRAVENOUS
  Filled 2016-05-17: qty 10

## 2016-05-17 MED ORDER — DEXTROSE 5 % IV SOLN
1.0000 g | INTRAVENOUS | Status: DC
  Administered 2016-05-18 – 2016-05-20 (×3): 1 g via INTRAVENOUS
  Filled 2016-05-17 (×3): qty 10

## 2016-05-17 MED ORDER — MORPHINE SULFATE (PF) 4 MG/ML IV SOLN: 4.0000 mg | Freq: Once | INTRAVENOUS | Status: AC | PRN

## 2016-05-17 MED ORDER — LEVOTHYROXINE SODIUM 75 MCG PO TABS
75.0000 ug | ORAL_TABLET | Freq: Every day | ORAL | Status: DC
  Administered 2016-05-18 – 2016-05-20 (×3): 75 ug via ORAL
  Filled 2016-05-17 (×4): qty 1

## 2016-05-17 MED ORDER — OXYCODONE-ACETAMINOPHEN 5-325 MG PO TABS: 1.0000 | ORAL_TABLET | ORAL | Status: DC | PRN

## 2016-05-17 MED ORDER — MORPHINE SULFATE (PF) 4 MG/ML IV SOLN
4.0000 mg | Freq: Once | INTRAVENOUS | Status: AC
  Administered 2016-05-17: 4 mg via INTRAVENOUS
  Filled 2016-05-17: qty 1

## 2016-05-17 MED ORDER — CHLORHEXIDINE GLUCONATE 0.12 % MT SOLN
15.0000 mL | Freq: Two times a day (BID) | OROMUCOSAL | Status: DC
Start: 2016-05-17 — End: 2016-05-20
  Administered 2016-05-17 – 2016-05-20 (×6): 15 mL via OROMUCOSAL
  Filled 2016-05-17 (×5): qty 15

## 2016-05-17 MED ORDER — METRONIDAZOLE IN NACL 5-0.79 MG/ML-% IV SOLN
500.0000 mg | Freq: Three times a day (TID) | INTRAVENOUS | Status: DC
  Administered 2016-05-17 – 2016-05-20 (×9): 500 mg via INTRAVENOUS
  Filled 2016-05-17 (×9): qty 100

## 2016-05-17 MED ORDER — CIPROFLOXACIN IN D5W 400 MG/200ML IV SOLN
400.0000 mg | Freq: Once | INTRAVENOUS | Status: AC
  Administered 2016-05-17: 400 mg via INTRAVENOUS
  Filled 2016-05-17: qty 200

## 2016-05-17 NOTE — ED Notes (Signed)
Bed: [id number] Expected date:  Expected time:  Means of arrival:  Comments: EMS/Syncope

## 2016-05-17 NOTE — ED Notes (Signed)
Pt taken to Xray via stretcher by rad tech.

## 2016-05-17 NOTE — ED Triage Notes (Signed)
Per EMS report.  Pt was having a BM-diarrhea this am, and reports a syncopal episode and fell forward hitting head on tub.  EMS cleared spine, pt denies blood thinners.  Pt lives with husband who called EMS. C/O abd pain RLQ/RUQ/RT flank to EMS on their arrival as she was sitting up in bed.  WBC's reported to be down via recent blood work and fever x last 2 days.  IVL #20 LAC.  EKG SR 76 by EMS. Hx arrhythmias.  BP 106/54, 76, 16, 98%, [id number].

## 2016-05-17 NOTE — ED Notes (Signed)
Hospitalist at bedside 

## 2016-05-17 NOTE — Consult Note (Signed)
Consultation  Referring Provider:  [facility] /Dr Renne Crigler Primary Care Physician:  Gar Ponto, MD Primary Gastroenterologist:  none.  Reason for Consultation:  Abnormal CT of abdomen  HPI: Tammy Ewing is a 70 y.o. female who is being admitted today through the emergency room after a syncopal episode at home. This episode occurred while she was on the commode with an episode of diarrhea and is felt to be vasovagal. We are asked to see the patient because CT of the abdomen and pelvis was done to further evaluate fall on her side with syncope. CT shows an 8.2 cm segment of abnormal [place] transverse colon with bowel wall thickening and also a thickened segment of sigmoid colon with surrounding inflammation. This is felt to be consistent with a segmental colitis. Patient has been undergoing outpatient evaluation for a severe leukopenia which has developed over the past month or so. She has seen Dr. Jana Hakim and is scheduled for a bone marrow biopsy later this week . By review of his notes he feels this this may be immune mediated , as patient has a positive ANA though with low titer at 1-80 Patient has history of breast cancer is status post bilateral mastectomies, hypertension, hyperlipidemia and status post cholecystectomy and TAH/BSO. Labs today in the ER showed a WBC of 1.6 neutrophil count of 0.5, hemoglobin 12.4 hematocrit of 36 and platelets of 124. Patient and husband both relate that she has history of IBS dating back at least 19 years. She says she did have one prior colonoscopy done by Dr. Lizbeth Bark in [facility] probably close to 10 years ago. She says her IBS has been manifested by intermittent episodes of urgency and diarrhea. She generally does not have any abdominal pain. Patient had 1 episode of diarrhea this morning with urgency. She says she was dizzy on her way to the bathroom and also felt nauseated but did not vomit. She passed out while in the  bathroom and hit her right chest wall. Imaging in the emergency room shows that she does have right lower anterior rib fractures ribs 8 and 9. She had not been noticing any abdominal pain at home and on exam in the emergency room was noted to be tender and therefore CT was ordered. Appetite is been fine and weight has been stable. Interestingly patient had a temp of over 101 last night at home and perhaps some chills, the night before temp was a little bit over 100. She has not been on any antibiotics, no new medications. In fact she was taken off of imipramine and simvastatin when she was seen by Dr. Jana Hakim  last week.      Past Medical History:  Diagnosis Date  . Breast CA ([place])   . Breast cancer ([place]) 07/21/2011  . Breast cancer, left breast ([place]) 07/21/2011  . High cholesterol   . Hypertension   . Irritable bowel syndrome (IBS)     Past Surgical History:  Procedure Laterality Date  . MASTECTOMY     bilateral    Prior to Admission medications   Medication Sig Start Date End Date Taking? Authorizing Provider  acetaminophen (TYLENOL) 500 MG tablet Take 1,000 mg by mouth every 6 (six) hours as needed.   Yes Historical Provider, MD  cetirizine (ZYRTEC) 10 MG tablet Take 10 mg by mouth daily.   Yes Historical Provider, MD  clonazePAM (KLONOPIN) 1 MG tablet Take 0.5 mg by mouth at bedtime.   Yes Historical Provider, MD  fluorometholone (FML) 0.1 % ophthalmic suspension Place 1 drop into both eyes every 14 (fourteen) days. 01/12/16  Yes Historical Provider, MD  levothyroxine (SYNTHROID, LEVOTHROID) 75 MCG tablet Take 75 mcg by mouth daily before breakfast.   Yes Historical Provider, MD  metoprolol (TOPROL-XL) 50 MG 24 hr tablet Take 50 mg by mouth daily.     Yes Caryl Bis, MD  pantoprazole (PROTONIX) 40 MG tablet Take 40 mg by mouth daily.   Yes Historical Provider, MD  imipramine (TOFRANIL) 25 MG tablet Take 25 mg by mouth at bedtime.      Historical Provider, MD  polycarbophil  (FIBERCON) 625 MG tablet Take 625 mg by mouth daily.      Historical Provider, MD  simvastatin (ZOCOR) 40 MG tablet Take 20 mg by mouth at bedtime.     Caryl Bis, MD    Current Facility-Administered Medications  Medication Dose Route Frequency Provider Last Rate Last Dose  . 0.9 %  sodium chloride infusion   Intravenous Continuous Margarita Mail, PA-C 125 mL/hr at 05/17/16 1159    . [START ON 05/18/2016] 0.9 %  sodium chloride infusion   Intravenous Continuous Saverio Danker, PA-C      . clonazePAM (KLONOPIN) tablet 0.5 mg  0.5 mg Oral QHS Mercy Riding, MD      . enoxaparin (LOVENOX) injection 40 mg  40 mg Subcutaneous Q24H Mercy Riding, MD      . fluorometholone (FML) 0.1 % ophthalmic suspension 1 drop  1 drop Both Eyes Q14 Days Mercy Riding, MD      . Derrill Memo ON 05/18/2016] levothyroxine (SYNTHROID, LEVOTHROID) tablet 75 mcg  75 mcg Oral QAC breakfast Mercy Riding, MD      . metoprolol succinate (TOPROL-XL) 24 hr tablet 50 mg  50 mg Oral Daily Mercy Riding, MD      . pantoprazole (PROTONIX) EC tablet 40 mg  40 mg Oral Daily Mercy Riding, MD       Current Outpatient Prescriptions  Medication Sig Dispense Refill  . acetaminophen (TYLENOL) 500 MG tablet Take 1,000 mg by mouth every 6 (six) hours as needed.    . cetirizine (ZYRTEC) 10 MG tablet Take 10 mg by mouth daily.    . clonazePAM (KLONOPIN) 1 MG tablet Take 0.5 mg by mouth at bedtime.    . fluorometholone (FML) 0.1 % ophthalmic suspension Place 1 drop into both eyes every 14 (fourteen) days.    Marland Kitchen levothyroxine (SYNTHROID, LEVOTHROID) 75 MCG tablet Take 75 mcg by mouth daily before breakfast.    . metoprolol (TOPROL-XL) 50 MG 24 hr tablet Take 50 mg by mouth daily.      . pantoprazole (PROTONIX) 40 MG tablet Take 40 mg by mouth daily.    Marland Kitchen imipramine (TOFRANIL) 25 MG tablet Take 25 mg by mouth at bedtime.      . polycarbophil (FIBERCON) 625 MG tablet Take 625 mg by mouth daily.      . simvastatin (ZOCOR) 40 MG tablet Take 20 mg by  mouth at bedtime.       Allergies as of 05/17/2016 - Review Complete 05/17/2016  Allergen Reaction Noted  . Pregabalin Other (See Comments) 07/21/2011  . Other Rash 05/17/2016    History reviewed. No pertinent family history.  Social History   Social History  . Marital status: Married    Spouse name: N/A  . Number of children: N/A  . Years of education: N/A   Occupational History  . Not  on file.   Social History Main Topics  . Smoking status: Current Every Day Smoker    Packs/day: 1.00    Years: 50.00    Types: Cigarettes  . Smokeless tobacco: Never Used  . Alcohol use No  . Drug use: No  . Sexual activity: Not Currently    Birth control/ protection: Post-menopausal   Other Topics Concern  . Not on file   Social History Narrative  . No narrative on file    Review of Systems: Pertinent positive and negative review of systems were noted in the above HPI section.  All other review of systems was otherwise negative.Marland Kitchen  Physical Exam: Vital signs in last 24 hours: Temp:  [98.4 F (36.9 C)] 98.4 F (36.9 C) (09/12 0833) Pulse Rate:  [65-109] 65 (09/12 1230) Resp:  [13-23] 19 (09/12 1230) BP: (108-120)/(53-65) 108/53 (09/12 1230) SpO2:  [97 %-98 %] 98 % (09/12 1230) Weight:  [158 lb (71.7 kg)] 158 lb (71.7 kg) (09/12 0840)   General:   Alert,  Well-developed, well-nourished,WF  pleasant and cooperative in NAD, husband at bedside Head:  Normocephalic and atraumatic. Eyes:  Sclera clear, no icterus.   Conjunctiva pink. Ears:  Normal auditory acuity. Nose:  No deformity, discharge,  or lesions. Mouth:  No deformity or lesions.   Neck:  Supple; no masses or thyromegaly. Lungs:  Clear throughout to auscultation.   No wheezes, crackles, or rhonchi. Heart:  Regular rate and rhythm; no murmurs, clicks, rubs,  or gallops. Abdomen:  Soft,tender LLQ , BS active,nonpalp mass or hsm, no guarding or rebound Rectal:  Deferred  Msk:  Symmetrical without gross deformities.-she  is tender left calf . Pulses:  Normal pulses noted. Extremities:  Without clubbing or edema. Neurologic:  Alert and  oriented x4;  grossly normal neurologically. Skin:  Intact without significant lesions or rashes.. Psych:  Alert and cooperative. Normal mood and affect.  Intake/Output from previous day: No intake/output data recorded. Intake/Output this shift: No intake/output data recorded.  Lab Results:  Recent Labs  05/17/16 0905 05/17/16 0924  WBC 1.6*  --   HGB 12.4 12.2  HCT 36.1 36.0  PLT 124*  --    BMET  Recent Labs  05/17/16 0905 05/17/16 0924  NA 137 139  K 3.4* 3.4*  CL 105 102  CO2 23  --   GLUCOSE 142* 138*  BUN 8 6  CREATININE 0.99 0.90  CALCIUM 8.5*  --    LFT  Recent Labs  05/17/16 0905 05/17/16 0907  PROT 6.8  --   ALBUMIN 3.9  --   AST 18  --   ALT 13*  --   ALKPHOS 102  --   BILITOT 1.5*  --   BILIDIR  --  0.3   PT/INR No results for input(s): LABPROT, INR in the last 72 hours. Hepatitis Panel No results for input(s): HEPBSAG, HCVAB, HEPAIGM, HEPBIGM in the last 72 hours.     IMPRESSION:  #83 70 year old female admitted today through the emergency room after syncopal episode at home with a  fall. Patient has new rib fractures of right eighth and ninth ribs #2 abnormal CT scan of the abdomen done for abdominal tenderness noted today and showing 2 areas of colonic wall thickening and surrounding inflammatory change consistent with a segmental colitis of unclear etiology. Suspect this may be infectious in a patient with fever at home over the past 2 nights and significant leukopenia. She has not had any bleeding or other symptoms concerning  for ischemic colitis. It is possible that she has underlying IBD but would be more concerned about infectious etiologies currently #3 new leukopenia-etiology not clear bone marrow biopsy scheduled for tomorrow #4 history of bilateral breast cancer status post bilateral mastectomies 5 status post  cholecystectomy and TAH/BSO #6 history of IBS-colonoscopy several years ago  Plan; GI pathogen panel IV Cipro and IV Flagyl have been ordered per hospitalist Regular diet Observe clinical course-she may need eventual colonoscopy Patient does not feel that she can undergo bone marrow biopsy tomorrow due to pain rib fractures. Dr. Jana Hakim  should be notified to follow while she's in the hospital and this probably does need to be done as soon as she is comfortable enough to undergo We will follow with you      Lyana Asbill  05/17/2016, 1:37 PM

## 2016-05-17 NOTE — ED Triage Notes (Signed)
Per pt report:  She was on toilet and had diarrhea, felt "swimmy headed" and "passed out" hitting RT side of chest/abd on the tub.  Denies hitting head on tub.  States she has a bone marrow bx scheduled for tomorrow and feels nervous about it.  Pt seems to be HOH.

## 2016-05-17 NOTE — ED Notes (Signed)
Pt walked with husband to bathroom to void.  Denied dizziness or pain other than RT rib-area pain.

## 2016-05-17 NOTE — ED Provider Notes (Signed)
[facility] DEPT Provider Note   CSN: [id number] Arrival date & time: 05/17/16  0831     History   Chief Complaint Chief Complaint  Patient presents with  . Loss of Consciousness    HPI Tammy Ewing is a 70 y.o. female who presents emergency Department with chief complaint of diarrhea and syncope. The patient states that when she woke up this morning she was feeling a bit lightheaded. She stood up to start her morning and then had the sudden urge to defecate. She went to the bathroom and states that all she remembers is her husband helping her up off of the floor. She states that she did have an episode of diarrhea and appropriate, but does not remember any of that. She passed out and hit her right rib cage on the side of her car but denies hitting her head. She denies neck pain. She denies abdominal pain, nausea or vomiting. She has one episode of previous loss of consciousness but was not associated with straining. The patient has a past medical history of breast cancer which she is in remission after about 4 years of anti-estrogens. She was recently found to have decreasing white blood cell count and neutropenia of current unknown origin. She is scheduled tomorrow for a bone marrow biopsy was 80 and a positive on her screaming and, according to the notes by Dr. Jana Hakim (oncology) is thought to potentially have an autoimmune process causing her leukopenia.. Patient states that she was running a fever of greater than 101 last night, but denies soaking night sweats or unexplained weight loss. She has a history of irritable bowel syndrome and was recently taken off of her IBS. Medications and cholesterol medications by Dr. Jana Hakim.  HPI  Past Medical History:  Diagnosis Date  . Breast CA   . Breast cancer 07/21/2011  . Breast cancer, left breast 07/21/2011  . High cholesterol   . Hypertension     Patient Active Problem List   Diagnosis Date Noted  . Neutropenia ([place])  05/11/2016  . Cytopenia 05/06/2016  . Breast cancer, right breast ([place]) 07/21/2011  . Breast cancer, left breast ([place]) 07/21/2011    No past surgical history on file.  OB History    No data available       Home Medications    Prior to Admission medications   Medication Sig Start Date End Date Taking? Authorizing Provider  aspirin EC 81 MG tablet Take 81 mg by mouth daily.    Historical Provider, MD  imipramine (TOFRANIL) 25 MG tablet Take 25 mg by mouth at bedtime.      Historical Provider, MD  letrozole (FEMARA) 2.5 MG tablet Take 1 tablet (2.5 mg total) by mouth daily. 02/19/13   Chauncey Cruel, MD  levothyroxine (SYNTHROID, LEVOTHROID) 88 MCG tablet Take 88 mcg by mouth daily.      Caryl Bis, MD  megestrol (MEGACE) 40 MG tablet TAKE 1 TABLET DAILY 01/25/13   Chauncey Cruel, MD  metoprolol (TOPROL-XL) 50 MG 24 hr tablet Take 50 mg by mouth daily.      Caryl Bis, MD  naproxen (NAPROSYN) 500 MG tablet Take 1 tablet (500 mg total) by mouth 2 (two) times daily with a meal. 04/15/13   Noemi Chapel, MD  ondansetron (ZOFRAN ODT) 4 MG disintegrating tablet Take 1 tablet (4 mg total) by mouth every 8 (eight) hours as needed for nausea. 04/15/13   Noemi Chapel, MD  oxyCODONE-acetaminophen (PERCOCET) 5-325 MG per tablet  Take 1 tablet by mouth every 4 (four) hours as needed for pain. 04/15/13   Noemi Chapel, MD  polycarbophil (FIBERCON) 625 MG tablet Take 625 mg by mouth daily.      Historical Provider, MD  prednisoLONE acetate (PRED MILD) 0.12 % ophthalmic suspension Place 1 drop into both eyes. Monday and Friday    Historical Provider, MD  simvastatin (ZOCOR) 40 MG tablet Take 20 mg by mouth at bedtime.     Caryl Bis, MD  sulfamethoxazole-trimethoprim (BACTRIM DS,SEPTRA DS) 800-160 MG per tablet Take 1 tablet by mouth 4 (four) times daily. 3 day therapy course patient completed.    Historical Provider, MD  tamsulosin (FLOMAX) 0.4 MG CAPS capsule Take 1 capsule (0.4 mg total) by  mouth 2 (two) times daily. 04/15/13   Noemi Chapel, MD    Family History No family history on file.  Social History Social History  Substance Use Topics  . Smoking status: Current Every Day Smoker    Packs/day: 1.00    Years: 50.00    Types: Cigarettes  . Smokeless tobacco: Never Used  . Alcohol use Not on file     Allergies   Pregabalin   Review of Systems Review of Systems Ten systems reviewed and are negative for acute change, except as noted in the HPI.    Physical Exam Updated Vital Signs BP 119/56 (BP Location: Left Arm)   Pulse 72   Temp 98.4 F (36.9 C) (Oral)   Resp 18   Ht _0  (1.575 m)   Wt 71.7 kg   SpO2 97%   BMI 28.90 kg/m   Physical Exam  Constitutional: She is oriented to person, place, and time. She appears well-developed and well-nourished. No distress.  Flushed skin, HOH  HENT:  Head: Normocephalic and atraumatic.  Eyes: Conjunctivae are normal. No scleral icterus.  Neck: Normal range of motion.  Cardiovascular: Normal rate, regular rhythm and normal heart sounds.  Exam reveals no gallop and no friction rub.   No murmur heard. Pulmonary/Chest: Effort normal and breath sounds normal. No respiratory distress. She exhibits tenderness.    Abdominal: Soft. Bowel sounds are normal. She exhibits no distension and no mass. Tenderness: LLQ. There is no guarding.  Neurological: She is alert and oriented to person, place, and time.  Skin: Skin is warm and dry. She is not diaphoretic.  Nursing note and vitals reviewed.    ED Treatments / Results  Labs (all labs ordered are listed, but only abnormal results are displayed) Labs Reviewed  CBG MONITORING, ED - Abnormal; Notable for the following:       Result Value   Glucose-Capillary 148 (*)    All other components within normal limits  CBC WITH DIFFERENTIAL/PLATELET  COMPREHENSIVE METABOLIC PANEL  LIPASE, BLOOD  URINALYSIS, ROUTINE W REFLEX MICROSCOPIC (NOT AT [facility])  POCT CBG (FASTING -  GLUCOSE)-MANUAL ENTRY  I-STAT CHEM 8, ED    EKG  EKG Interpretation  Date/Time:  Tuesday May 17 2016 08:42:38 EDT Ventricular Rate:  74 PR Interval:    QRS Duration: 90 QT Interval:  385 QTC Calculation: 428 R Axis:   46 Text Interpretation:  nsr Probable anteroseptal infarct, old Baseline wander in lead(s) I III aVL T wave changes no longer present.  Rate now normal Confirmed by GOLDSTON MD, SCOTT [phone]) on 05/17/2016 8:51:32 AM       Radiology No results found.  Procedures Procedures (including critical care time)  Medications Ordered in ED Medications  sodium chloride 0.9 %  bolus 1,000 mL (not administered)    And  0.9 %  sodium chloride infusion (not administered)  iopamidol (ISOVUE-300) 61 % injection 30 mL (not administered)  morphine 4 MG/ML injection 4 mg (not administered)  ondansetron (ZOFRAN) injection 4 mg (not administered)     Initial Impression / Assessment and Plan / ED Course  I have reviewed the triage vital signs and the nursing notes.  Pertinent labs & imaging results that were available during my care of the patient were reviewed by me and considered in my medical decision making (see chart for details).  Clinical Course  Value Comment By Time   Patient placed on new for her peanut precautions. Labs are pending.  The patient's urine with elevated protein, moderate amount of bilirubin, Margarita Mail, PA-C 09/12 0911   Patient has nitrate positive urine, large amount of bacteria, a few white blood cells. The patient also has moderate bilirubin. I did ordered a direct bilirubin, she also has trace hemoglobin in her urine with no red blood cells in the differential have added on a CK. Patient's other labs are currently pending. Given her fever last night, neutropenic state, and abdominal pain. I feel that CT is warranted to rule out infection in the abdomen. AS SHE IS  tender on examination. Margarita Mail, PA-C 09/12 0913  NEUT#: (!) 0.5  Patient with UTI. Leukopenic  and neutorphils are decreasing further. Margarita Mail, PA-C 09/12 [phone]   Patient with abnormal findings on the CT exam concerning for infectious versus inflammatory process in the bowel. Patient also has urinary tract infection and her DVT study is positive for superficial thrombophlebitis of the left saphenous vein. Considering the patient had a syncopal episode, has neutropenia with potential multiple infections including urinary tract infection that she warrants admission. Margarita Mail, PA-C 09/12 1129      Final Clinical Impressions(s) / ED Diagnoses   Final diagnoses:  None    New Prescriptions New Prescriptions   No medications on file     Margarita Mail, PA-C 05/18/16 [place], MD 05/18/16 [phone]

## 2016-05-17 NOTE — H&P (Signed)
History and Physical    Tammy Ewing [id number] DOB: 1946-09-05 DOA: 05/17/2016  PCP: Gar Ponto, MD  Outpatient Specialists: Dr. Jana Hakim (hem/onc) Patient coming from: home  Chief Complaint: syncope  HPI: Tammy Ewing is a 70 y.o. female with medical history significant of breast cancer s/p double mastectomy, implant and removal of implant, (no chmo or radiation), now in remission after 4 years of tamoxifen, also cytopenia, hypertension and HLD.   Patient was in her usual state of health until last night when she had mild fever. She didn't check her temperature. She went to bed and woke up this morning about 7 am and felt woozy when she went to bathroom. She reports having diarrhea that she describes as loose stool. She denies blood in stool. Then she fell of the comode and ended up on the floor on her right side. When she regained her consciousness, her husband was on the phone calling 911. She denies postictal symptoms, speech problem, focal weakness or facial drooping.  She denies chest pain, shortness of breath, palpitation, nausea, vomiting, dysuria, hematuria, back or flank pain. She is now hurting where she broke her ribs after fall. She denies night sweats or unexplained weight loss. She has left leg cramping for the last few days not related to exertion. Denies leg swelling.   Patient denies personal or family history of cardiac disease. She doesn't know about her father side of family.  Patient sees Dr. Jana Hakim (oncology) for breast cancer and cytopenia. She says there is a plan for bone biopsy tomorrow (9/13).   She reports smoking about a pack a day but not interested in nicotine patches. Denies drinking EtOH or drug use.   ED Course: vital signs within normal limit. BMP significant for K of 3.4 and mildly low iCa to 1.11. CK 19. CXR fracture of the right eighth and ninth ribs and negative for pneumothorax or acute pulmonary abnormality. CT abdomen with  decreased in caliber and  abnormal wall thickening of transverse colon and marked diffuse bowel wall thickening and inflammation of sigmoid colon concerning for IBD (Crhon's). LE doppler negative for DVT. Received Cipro, Flagyl and CTX in ED.  Hospitalist service was asked for admission for further evaluation of her syncope, UTI and CT finding of her colon.   Review of Systems: As per HPI otherwise 10 point review of systems negative.   Past Medical History:  Diagnosis Date  . Breast CA ([place])   . Breast cancer ([place]) 07/21/2011  . Breast cancer, left breast ([place]) 07/21/2011  . High cholesterol   . Hypertension   . Irritable bowel syndrome (IBS)     Past Surgical History:  Procedure Laterality Date  . MASTECTOMY     bilateral     reports that she has been smoking Cigarettes.  She has a 50.00 pack-year smoking history. She has never used smokeless tobacco. She reports that she does not drink alcohol or use drugs.  Allergies  Allergen Reactions  . Pregabalin Other (See Comments)    "Hallucinations"  . Other Rash    bandaids     History reviewed. No pertinent family history.  Prior to Admission medications   Medication Sig Start Date End Date Taking? Authorizing Provider  acetaminophen (TYLENOL) 500 MG tablet Take 1,000 mg by mouth every 6 (six) hours as needed.   Yes Historical Provider, MD  cetirizine (ZYRTEC) 10 MG tablet Take 10 mg by mouth daily.   Yes Historical Provider, MD  clonazePAM (KLONOPIN) 1 MG  tablet Take 0.5 mg by mouth at bedtime.   Yes Historical Provider, MD  fluorometholone (FML) 0.1 % ophthalmic suspension Place 1 drop into both eyes every 14 (fourteen) days. 01/12/16  Yes Historical Provider, MD  levothyroxine (SYNTHROID, LEVOTHROID) 75 MCG tablet Take 75 mcg by mouth daily before breakfast.   Yes Historical Provider, MD  metoprolol (TOPROL-XL) 50 MG 24 hr tablet Take 50 mg by mouth daily.     Yes Caryl Bis, MD  pantoprazole (PROTONIX) 40 MG tablet Take 40  mg by mouth daily.   Yes Historical Provider, MD  imipramine (TOFRANIL) 25 MG tablet Take 25 mg by mouth at bedtime.      Historical Provider, MD  polycarbophil (FIBERCON) 625 MG tablet Take 625 mg by mouth daily.      Historical Provider, MD  simvastatin (ZOCOR) 40 MG tablet Take 20 mg by mouth at bedtime.     Caryl Bis, MD    Physical Exam: Vitals:   05/17/16 0840 05/17/16 1130 05/17/16 1200 05/17/16 1230  BP:  120/63 110/65 (!) 108/53  Pulse:  68 109 65  Resp:  23 13 19   Temp:      TempSrc:      SpO2:  97% 97% 98%  Weight: 71.7 kg (158 lb)     Height: 5\' 2"  (1.575 m)         Constitutional: NAD, calm, comfortable Vitals:   05/17/16 0840 05/17/16 1130 05/17/16 1200 05/17/16 1230  BP:  120/63 110/65 (!) 108/53  Pulse:  68 109 65  Resp:  23 13 19   Temp:      TempSrc:      SpO2:  97% 97% 98%  Weight: 71.7 kg (158 lb)     Height: 5\' 2"  (1.575 m)      Eyes: PERRL, lids and conjunctivae normal ENMT: Mucous membranes are moist. Posterior pharynx clear of any exudate or lesions.Normal dentition.  Neck: normal, supple, no masses, no thyromegaly Respiratory: clear to auscultation bilaterally, no wheezing, no crackles. Normal respiratory effort. No accessory muscle use.  Cardiovascular: Regular rate and rhythm, no murmurs / rubs / gallops. No extremity edema. 2+ pedal pulses.  Abdomen: tenderness over LLQ, no masses palpated. Bowel sounds positive. No suprapubic or CVA tenderness Musculoskeletal: tender to palpation over her lower right chest, no swelling or overlying skin erythema.  Skin: no rashes, lesions, ulcers. No induration Neurologic: CN 2-12 grossly intact. Strength 5/5 in all 4.  Psychiatric: Normal judgment and insight. Alert and oriented x 3. Normal mood.   Labs on Admission: I have personally reviewed following labs and imaging studies  CBC:  Recent Labs Lab 05/17/16 0905 05/17/16 0924  WBC 1.6*  --   NEUTROABS 0.5*  --   HGB 12.4 12.2  HCT 36.1 36.0    MCV 91.2  --   PLT 124*  --    Basic Metabolic Panel:  Recent Labs Lab 05/17/16 0905 05/17/16 0924  NA 137 139  K 3.4* 3.4*  CL 105 102  CO2 23  --   GLUCOSE 142* 138*  BUN 8 6  CREATININE 0.99 0.90  CALCIUM 8.5*  --    GFR: Estimated Creatinine Clearance: 53.1 mL/min (by C-G formula based on SCr of 0.9 mg/dL). Liver Function Tests:  Recent Labs Lab 05/17/16 0905  AST 18  ALT 13*  ALKPHOS 102  BILITOT 1.5*  PROT 6.8  ALBUMIN 3.9    Recent Labs Lab 05/17/16 0905  LIPASE 16   No results  for input(s): AMMONIA in the last 168 hours. Coagulation Profile: No results for input(s): INR, PROTIME in the last 168 hours. Cardiac Enzymes:  Recent Labs Lab 05/17/16 0907  CKTOTAL 19*   BNP (last 3 results) No results for input(s): PROBNP in the last 8760 hours. HbA1C: No results for input(s): HGBA1C in the last 72 hours. CBG:  Recent Labs Lab 05/17/16 0854  GLUCAP 148*   Lipid Profile: No results for input(s): CHOL, HDL, LDLCALC, TRIG, CHOLHDL, LDLDIRECT in the last 72 hours. Thyroid Function Tests: No results for input(s): TSH, T4TOTAL, FREET4, T3FREE, THYROIDAB in the last 72 hours. Anemia Panel: No results for input(s): VITAMINB12, FOLATE, FERRITIN, TIBC, IRON, RETICCTPCT in the last 72 hours. Urine analysis:    Component Value Date/Time   COLORURINE ORANGE (A) 05/17/2016 0845   APPEARANCEUR CLOUDY (A) 05/17/2016 0845   LABSPEC 1.037 (H) 05/17/2016 0845   PHURINE 6.0 05/17/2016 0845   GLUCOSEU 100 (A) 05/17/2016 0845   HGBUR TRACE (A) 05/17/2016 0845   BILIRUBINUR MODERATE (A) 05/17/2016 0845   KETONESUR NEGATIVE 05/17/2016 0845   PROTEINUR 100 (A) 05/17/2016 0845   UROBILINOGEN 1.0 04/15/2013 0446   NITRITE POSITIVE (A) 05/17/2016 0845   LEUKOCYTESUR SMALL (A) 05/17/2016 0845   Sepsis Labs: @LABRCNTIP (procalcitonin:4,lacticidven:4) )No results found for this or any previous visit (from the past 240 hour(s)).   Radiological Exams on  Admission: Dg Ribs Unilateral W/chest Right  Result Date: 05/17/2016 CLINICAL DATA:  Pt was sitting on toilet this a.m and got lightheaded and fell against tub hitting rt lateral ribs, pain rt lateral ribs with some sob, no other chest complaints EXAM: RIGHT RIBS AND CHEST - 3+ VIEW COMPARISON:  07/14/2011 FINDINGS: Heart size is normal. The lungs are clear. There are no focal consolidations or pleural effusions. No pulmonary edema. Surgical clips are identified in the axillary regions and right upper quadrant. Oblique views of the ribs show an acute fracture of the right lateral eighth and ninth ribs. No pneumothorax. IMPRESSION: 1. Fracture of the right eighth and ninth ribs. 2. No pneumothorax or acute pulmonary abnormality. Electronically Signed   By: Nolon Nations M.D.   On: 05/17/2016 09:34   Ct Abdomen Pelvis W Contrast  Result Date: 05/17/2016 CLINICAL DATA:  Fall. EXAM: CT ABDOMEN AND PELVIS WITH CONTRAST TECHNIQUE: Multidetector CT imaging of the abdomen and pelvis was performed using the standard protocol following bolus administration of intravenous contrast. CONTRAST:  185mL ISOVUE-300 IOPAMIDOL (ISOVUE-300) INJECTION 61%, 37mL ISOVUE-300 IOPAMIDOL (ISOVUE-300) INJECTION 61% COMPARISON:  04/15/2013 FINDINGS: Lower chest: There is no pleural or pericardial effusion. Hepatobiliary: Exophytic cyst arising from segment 5 of the liver measures 1.3 cm in is unchanged from previous exam. Previous cholecystectomy. No common bile duct dilatation. Pancreas: Unremarkable. No pancreatic ductal dilatation or surrounding inflammatory changes. Spleen: Normal in size without focal abnormality. Adrenals/Urinary Tract: Adrenal glands are unremarkable. Kidneys are normal, without renal calculi, focal lesion, or hydronephrosis. Bladder is unremarkable. Stomach/Bowel: The stomach is normal. The small bowel loops have a normal course and caliber. There is no bowel obstruction. Normal appearance of the terminal  ileum. There is a 8.2 cm segment of abnormal proximal transverse colon. There is decrease in caliber of the bowel within the segment and there is abnormal wall thickening/ edema which measures up to 8 mm. Separately, there is a segment of sigmoid colon which is abnormally thickened with surrounding inflammation. Wall thickness measures up to 1.3 cm, image 67 of series 2. There is diffuse surrounding inflammatory changes including fat  stranding in hyperemia of the Vasa recta. No pathologic dilatation of the colon. There is no free air or fluid collections identified. Distal colonic diverticula noted. Vascular/Lymphatic: Calcified atherosclerotic disease involves the abdominal aorta. No aneurysm. No enlarged retroperitoneal or mesenteric adenopathy. No enlarged pelvic or inguinal lymph nodes. Reproductive: Uterus and bilateral adnexa are unremarkable. Other: There is no ascites or focal fluid collections within the abdomen or pelvis. Musculoskeletal: Degenerative disc disease identified within the lumbar spine. IMPRESSION: 1. There are 2 segments of abnormal: . The first segment involves the proximal transverse colon and measures approximately 8 cm. Here the bowel is decreased in caliber and there is abnormal wall thickening. The second segment involves the sigmoid colon were there is marked diffuse bowel wall thickening and inflammation. Findings are favored to reflect multifocal segmental colitis secondary to inflammatory bowel disease such as Crohn's disease. Less likely, inflammatory changes of the sigmoid colon may reflect diverticulitis with low grade inflammatory changes involving the proximal transverse colon secondary to inflammatory bowel disease. 2. No evidence for bowel obstruction, perforation or fistula formation. Electronically Signed   By: Kerby Moors M.D.   On: 05/17/2016 10:38    EKG: Independently reviewed.  Assessment/Plan Active Problems:   Syncope   Syncope: appears vasovagal.  History of this few years ago. No significant cardiac history. HDS. Normal EKG in ED. UTI could have some contribution as patient was dizzy on her way to bathroom. She also had subjective fever. -Admit to telemetry -Orthostatic vitals -Cardiac monitor -TSH -Regular diet. D/c IVF  UTI: dirty UA with many bacteria, LE and nitrite.  -Urine culture -CTX with a plan to narrow antibiotics  Rib fracture: right 8th & 9th rib fracture.  -Conservative therapy -Pain meds.   Cytopenia: patient with leukopenia and thrombocytopenia. Followed by Dr. Jana Hakim as outpt. There was a plan for bone biopsy for tomorrow (9/13).  -Talked to Dr, Jana Hakim who recommended changing the biopsy order to inpatient as long as she is not febrile. However, patient wants the biopsy postponed.  Concern for IBD: CT finding with decreased in caliber and  abnormal wall thickening of transverse colon and marked diffuse bowel wall thickening and inflammation of sigmoid colon concerning for IBD. She has history of IBS. Can't rule out infectious colitis -[facility] consulted  -Continue flagyl and CTX  Hypertension: stable. On metoprolol XL 50 mg -continue home meds  Hypothyroidism: check TSH -continue home synthroid  History of Breast cancer: s/p bilateral mastectomy, implant and removal of implant. No chemo or radiation. Treated with tamoxifen -No intervention at this time.  Tobacco use disorder: 1PPD -declined nicotine patch now.  Insomnia:  -continue home med.  DVT prophylaxis: Lovenox Code Status: Full Family Communication: husband at bedside Disposition Plan: home Consults called: GI.  Admission status: telemetry   Wendee Beavers, MD FM resident Page [phone] If 7PM-7AM, please contact night-coverage [email or web address] Password [email or web address]  05/17/2016, 1:26 PM

## 2016-05-17 NOTE — Progress Notes (Signed)
*  PRELIMINARY RESULTS* Vascular Ultrasound Left lower extremity venous duplex has been completed.  Preliminary findings: no evidence of DVT. Superficial thrombosis noted in the left small saphenous vein.    Landry Mellow, RDMS, RVT  05/17/2016, 11:32 AM

## 2016-05-18 ENCOUNTER — Ambulatory Visit (HOSPITAL_COMMUNITY): Payer: Medicare Other

## 2016-05-18 ENCOUNTER — Ambulatory Visit (HOSPITAL_COMMUNITY): Admission: RE | Admit: 2016-05-18 | Payer: Medicare Other | Source: Ambulatory Visit

## 2016-05-18 DIAGNOSIS — R197 Diarrhea, unspecified: Secondary | ICD-10-CM

## 2016-05-18 DIAGNOSIS — R55 Syncope and collapse: Secondary | ICD-10-CM

## 2016-05-18 DIAGNOSIS — R0781 Pleurodynia: Secondary | ICD-10-CM

## 2016-05-18 DIAGNOSIS — D696 Thrombocytopenia, unspecified: Secondary | ICD-10-CM

## 2016-05-18 DIAGNOSIS — M79605 Pain in left leg: Secondary | ICD-10-CM

## 2016-05-18 DIAGNOSIS — D759 Disease of blood and blood-forming organs, unspecified: Secondary | ICD-10-CM

## 2016-05-18 DIAGNOSIS — R509 Fever, unspecified: Secondary | ICD-10-CM

## 2016-05-18 DIAGNOSIS — W19XXXA Unspecified fall, initial encounter: Secondary | ICD-10-CM

## 2016-05-18 LAB — URINE CULTURE

## 2016-05-18 LAB — CBC
HCT: 34.6 % — ABNORMAL LOW (ref 36.0–46.0)
HEMOGLOBIN: 11.3 g/dL — AB (ref 12.0–15.0)
MCH: 30.7 pg (ref 26.0–34.0)
MCHC: 32.7 g/dL (ref 30.0–36.0)
MCV: 94 fL (ref 78.0–100.0)
Platelets: 112 10*3/uL — ABNORMAL LOW (ref 150–400)
RBC: 3.68 MIL/uL — AB (ref 3.87–5.11)
RDW: 13.6 % (ref 11.5–15.5)
WBC: 1.8 10*3/uL — ABNORMAL LOW (ref 4.0–10.5)

## 2016-05-18 LAB — GLUCOSE, CAPILLARY: Glucose-Capillary: 106 mg/dL — ABNORMAL HIGH (ref 65–99)

## 2016-05-18 MED ORDER — CYCLOBENZAPRINE HCL 5 MG PO TABS
5.0000 mg | ORAL_TABLET | Freq: Three times a day (TID) | ORAL | Status: DC | PRN
  Administered 2016-05-18: 5 mg via ORAL
  Filled 2016-05-18 (×2): qty 1

## 2016-05-18 MED ORDER — POTASSIUM CHLORIDE CRYS ER 20 MEQ PO TBCR
40.0000 meq | EXTENDED_RELEASE_TABLET | Freq: Once | ORAL | Status: AC
  Administered 2016-05-18: 40 meq via ORAL
  Filled 2016-05-18: qty 2

## 2016-05-18 MED ORDER — SODIUM CHLORIDE 0.9 % IV BOLUS (SEPSIS)
500.0000 mL | Freq: Once | INTRAVENOUS | Status: AC
  Administered 2016-05-18: 500 mL via INTRAVENOUS

## 2016-05-18 NOTE — Progress Notes (Signed)
PROGRESS NOTE    [name]  JYN:[id number] DOB: [birth year] DOA: 05/17/2016 PCP: Gar Ponto, MD   Chief Complaint  Patient presents with  . Loss of Consciousness    Brief Narrative:  HPI on 05/17/2016 by Dr. Wendee Beavers (resident) Tammy Ewing is a 70 y.o. female with medical history significant of breast cancer s/p double mastectomy, implant and removal of implant, (no chmo or radiation), now in remission after 4 years of tamoxifen, also cytopenia, hypertension and HLD.  Patient was in her usual state of health until last night when she had mild fever. She didn't check her temperature. She went to bed and woke up this morning about 7 am and felt woozy when she went to bathroom. She reports having diarrhea that she describes as loose stool. She denies blood in stool. Then she fell of the comode and ended up on the floor on her right side. When she regained her consciousness, her husband was on the phone calling 911. She denies postictal symptoms, speech problem, focal weakness or facial drooping.  She denies chest pain, shortness of breath, palpitation, nausea, vomiting, dysuria, hematuria, back or flank pain. She is now hurting where she broke her ribs after fall. She denies night sweats or unexplained weight loss. She has left leg cramping for the last few days not related to exertion. Denies leg swelling.  Patient denies personal or family history of cardiac disease. She doesn't know about her father side of family. Patient sees Dr. Jana Hakim (oncology) for breast cancer and cytopenia. She says there is a plan for bone biopsy tomorrow (9/13).  She reports smoking about a pack a day but not interested in nicotine patches. Denies drinking EtOH or drug use.   Assessment & Plan   Syncope -Likely vasovagal from GI losses -PT consulted  UTI -Urine culture shows multiple species -Currently on ciprofloxacin  Thrombocytopenia/Luekopenia -Unknown etiology  -Oncology consulted and  appreciated.  Patient was supposed to have bone marrow biopsy today, however will be rescheduled -Follow up with Dr. Jana Hakim on Oct 3  Rib fracture -Secondary to syncopal event/fall -8th and 9th right ribs -Continue pain control  IBD/colitis -Gastroenterology consulted and appreciated -Will likely need colonoscopy as an outpatient -CT showed segmental wall thickening in the transverse and sigmoid colon, question whether this is incidental finding -Patient a longer having diarrhea -Placed on ciprofloxacin and Flagyl empirically -GI pathogen panel ordered, however, no further bowel movements   Essential hypertension -continue metoprolol  Hypothyroidism -Continue synthroid  History of breast cancer -S/p bilateral mastectomy, implant, and removal -Treated with tamoxifen  Tobacco Abuse disorder -Smoking cessation discussed -Declined tobacco abuse  Insomnia -Continue Klonopin  DVT Prophylaxis  Lovenox  Code Status: Full   Family Communication: Husband  Disposition Plan: Admitted   Consultants Oncology Gastroenterology  Procedures  None  Antibiotics   Anti-infectives    Start     Dose/Rate Route Frequency Ordered Stop   05/18/16 0900  cefTRIAXone (ROCEPHIN) 1 g in dextrose 5 % 50 mL IVPB     1 g 100 mL/hr over 30 Minutes Intravenous Every 24 hours 05/17/16 1418     05/17/16 2000  metroNIDAZOLE (FLAGYL) IVPB 500 mg     500 mg 100 mL/hr over 60 Minutes Intravenous Every 8 hours 05/17/16 1424     05/17/16 1115  ciprofloxacin (CIPRO) IVPB 400 mg     400 mg 200 mL/hr over 60 Minutes Intravenous  Once 05/17/16 1103 05/17/16 1322   05/17/16 1115  metroNIDAZOLE (FLAGYL)  IVPB 500 mg     500 mg 100 mL/hr over 60 Minutes Intravenous  Once 05/17/16 1103 05/17/16 1322   05/17/16 0930  cefTRIAXone (ROCEPHIN) 1 g in dextrose 5 % 50 mL IVPB     1 g 100 mL/hr over 30 Minutes Intravenous  Once 05/17/16 0916 05/17/16 1147      Subjective:   Brantley Stage seen and  examined today.  Continues to have right sided rib pain.  Feels diarrhea has improved. States she has been battling IBS for years. Denies chest pain, shortness of breath, nausea/vomiting, dizziness, headache.  Objective:   Vitals:   05/17/16 1230 05/17/16 1413 05/17/16 2110 05/18/16 0501  BP: (!) 108/53 (!) 127/59 (!) 124/57 120/61  Pulse: 65 68 81 69  Resp: 19 18 18 16   Temp:  98.6 F (37 C) (!) 100.6 F (38.1 C) 98.3 F (36.8 C)  TempSrc:  Oral Oral Oral  SpO2: 98% 100% 93% 94%  Weight:  73.6 kg (162 lb 4.1 oz)    Height:  5' 2"  (1.575 m)      Intake/Output Summary (Last 24 hours) at 05/18/16 1414 Last data filed at 05/18/16 0900  Gross per 24 hour  Intake          2521.25 ml  Output                0 ml  Net          2521.25 ml   Filed Weights   05/17/16 0840 05/17/16 1413  Weight: 71.7 kg (158 lb) 73.6 kg (162 lb 4.1 oz)    Exam  General: Well developed, well nourished, NAD, appears stated age  HEENT: NCAT, mucous membranes moist.   Cardiovascular: S1 S2 auscultated, no rubs, murmurs or gallops. Regular rate and rhythm.  Respiratory: Clear to auscultation bilaterally with equal chest rise  Abdomen: Soft, nontender, nondistended, + bowel sounds  Extremities: warm dry without cyanosis clubbing or edema  Neuro: AAOx3, nonfocal  Psych: Normal affect and demeanor with intact judgement and insight   Data Reviewed: I have personally reviewed following labs and imaging studies  CBC:  Recent Labs Lab 05/17/16 0905 05/17/16 0924 05/18/16 0600  WBC 1.6*  --  1.8*  NEUTROABS 0.5*  --   --   HGB 12.4 12.2 11.3*  HCT 36.1 36.0 34.6*  MCV 91.2  --  94.0  PLT 124*  --  151*   Basic Metabolic Panel:  Recent Labs Lab 05/17/16 0905 05/17/16 0924  NA 137 139  K 3.4* 3.4*  CL 105 102  CO2 23  --   GLUCOSE 142* 138*  BUN 8 6  CREATININE 0.99 0.90  CALCIUM 8.5*  --    GFR: Estimated Creatinine Clearance: 53.9 mL/min (by C-G formula based on SCr of 0.9  mg/dL). Liver Function Tests:  Recent Labs Lab 05/17/16 0905  AST 18  ALT 13*  ALKPHOS 102  BILITOT 1.5*  PROT 6.8  ALBUMIN 3.9    Recent Labs Lab 05/17/16 0905  LIPASE 16   No results for input(s): AMMONIA in the last 168 hours. Coagulation Profile: No results for input(s): INR, PROTIME in the last 168 hours. Cardiac Enzymes:  Recent Labs Lab 05/17/16 0907  CKTOTAL 19*   BNP (last 3 results) No results for input(s): PROBNP in the last 8760 hours. HbA1C: No results for input(s): HGBA1C in the last 72 hours. CBG:  Recent Labs Lab 05/17/16 0854 05/18/16 0738  GLUCAP 148* 106*   Lipid Profile:  No results for input(s): CHOL, HDL, LDLCALC, TRIG, CHOLHDL, LDLDIRECT in the last 72 hours. Thyroid Function Tests:  Recent Labs  05/17/16 1417  TSH 1.839   Anemia Panel: No results for input(s): VITAMINB12, FOLATE, FERRITIN, TIBC, IRON, RETICCTPCT in the last 72 hours. Urine analysis:    Component Value Date/Time   COLORURINE ORANGE (A) 05/17/2016 0845   APPEARANCEUR CLOUDY (A) 05/17/2016 0845   LABSPEC 1.037 (H) 05/17/2016 0845   PHURINE 6.0 05/17/2016 0845   GLUCOSEU 100 (A) 05/17/2016 0845   HGBUR TRACE (A) 05/17/2016 0845   BILIRUBINUR MODERATE (A) 05/17/2016 0845   KETONESUR NEGATIVE 05/17/2016 0845   PROTEINUR 100 (A) 05/17/2016 0845   UROBILINOGEN 1.0 04/15/2013 0446   NITRITE POSITIVE (A) 05/17/2016 0845   LEUKOCYTESUR SMALL (A) 05/17/2016 0845   Sepsis Labs: @LABRCNTIP (procalcitonin:4,lacticidven:4)  ) Recent Results (from the past 240 hour(s))  Urine culture     Status: Abnormal   Collection Time: 05/17/16  8:45 AM  Result Value Ref Range Status   Specimen Description URINE, CLEAN CATCH  Final   Special Requests NONE  Final   Culture MULTIPLE SPECIES PRESENT, SUGGEST RECOLLECTION (A)  Final   Report Status 05/18/2016 FINAL  Final      Radiology Studies: Dg Ribs Unilateral W/chest Right  Result Date: 05/17/2016 CLINICAL DATA:  Pt was  sitting on toilet this a.m and got lightheaded and fell against tub hitting rt lateral ribs, pain rt lateral ribs with some sob, no other chest complaints EXAM: RIGHT RIBS AND CHEST - 3+ VIEW COMPARISON:  07/14/2011 FINDINGS: Heart size is normal. The lungs are clear. There are no focal consolidations or pleural effusions. No pulmonary edema. Surgical clips are identified in the axillary regions and right upper quadrant. Oblique views of the ribs show an acute fracture of the right lateral eighth and ninth ribs. No pneumothorax. IMPRESSION: 1. Fracture of the right eighth and ninth ribs. 2. No pneumothorax or acute pulmonary abnormality. Electronically Signed   By: Nolon Nations M.D.   On: 05/17/2016 09:34   Ct Abdomen Pelvis W Contrast  Result Date: 05/17/2016 CLINICAL DATA:  Fall. EXAM: CT ABDOMEN AND PELVIS WITH CONTRAST TECHNIQUE: Multidetector CT imaging of the abdomen and pelvis was performed using the standard protocol following bolus administration of intravenous contrast. CONTRAST:  142m ISOVUE-300 IOPAMIDOL (ISOVUE-300) INJECTION 61%, 379mISOVUE-300 IOPAMIDOL (ISOVUE-300) INJECTION 61% COMPARISON:  04/15/2013 FINDINGS: Lower chest: There is no pleural or pericardial effusion. Hepatobiliary: Exophytic cyst arising from segment 5 of the liver measures 1.3 cm in is unchanged from previous exam. Previous cholecystectomy. No common bile duct dilatation. Pancreas: Unremarkable. No pancreatic ductal dilatation or surrounding inflammatory changes. Spleen: Normal in size without focal abnormality. Adrenals/Urinary Tract: Adrenal glands are unremarkable. Kidneys are normal, without renal calculi, focal lesion, or hydronephrosis. Bladder is unremarkable. Stomach/Bowel: The stomach is normal. The small bowel loops have a normal course and caliber. There is no bowel obstruction. Normal appearance of the terminal ileum. There is a 8.2 cm segment of abnormal proximal transverse colon. There is decrease in  caliber of the bowel within the segment and there is abnormal wall thickening/ edema which measures up to 8 mm. Separately, there is a segment of sigmoid colon which is abnormally thickened with surrounding inflammation. Wall thickness measures up to 1.3 cm, image 67 of series 2. There is diffuse surrounding inflammatory changes including fat stranding in hyperemia of the Vasa recta. No pathologic dilatation of the colon. There is no free air or fluid collections identified.  Distal colonic diverticula noted. Vascular/Lymphatic: Calcified atherosclerotic disease involves the abdominal aorta. No aneurysm. No enlarged retroperitoneal or mesenteric adenopathy. No enlarged pelvic or inguinal lymph nodes. Reproductive: Uterus and bilateral adnexa are unremarkable. Other: There is no ascites or focal fluid collections within the abdomen or pelvis. Musculoskeletal: Degenerative disc disease identified within the lumbar spine. IMPRESSION: 1. There are 2 segments of abnormal: . The first segment involves the proximal transverse colon and measures approximately 8 cm. Here the bowel is decreased in caliber and there is abnormal wall thickening. The second segment involves the sigmoid colon were there is marked diffuse bowel wall thickening and inflammation. Findings are favored to reflect multifocal segmental colitis secondary to inflammatory bowel disease such as Crohn's disease. Less likely, inflammatory changes of the sigmoid colon may reflect diverticulitis with low grade inflammatory changes involving the proximal transverse colon secondary to inflammatory bowel disease. 2. No evidence for bowel obstruction, perforation or fistula formation. Electronically Signed   By: Kerby Moors M.D.   On: 05/17/2016 10:38     Scheduled Meds: . cefTRIAXone (ROCEPHIN) IVPB 1 gram/50 mL D5W  1 g Intravenous Q24H  . chlorhexidine  15 mL Mouth/Throat BID  . clonazePAM  0.5 mg Oral QHS  . enoxaparin (LOVENOX) injection  40 mg  Subcutaneous Q24H  . [START ON 05/30/2016] fluorometholone  1 drop Both Eyes Q14 Days  . levothyroxine  75 mcg Oral QAC breakfast  . metoprolol succinate  50 mg Oral Daily  . metronidazole  500 mg Intravenous Q8H  . pantoprazole  40 mg Oral Daily   Continuous Infusions: . sodium chloride 125 mL/hr at 05/18/16 0421     LOS: 1 day   Time Spent in minutes   30 minutes  Kyiesha Millward D.O. on 05/18/2016 at 2:14 PM  Between 7am to 7pm - Pager - [phone]  After 7pm go to [email or web address] - password [email or web address]  And look for the night coverage person covering for me after hours  [facility] Office  [phone]

## 2016-05-18 NOTE — Progress Notes (Signed)
Patient ID: Tammy Ewing, female   DOB: October 24, 1945, 70 y.o.   MRN: [id number]    Progress Note   Subjective   Fever last night..feels miserable with pain /spasms from rib fractures No further stool or diarrhea- GI path panel not collected Urine culture multiple species  WBC 1.8, plts 112, hgb 11.3   Objective   Vital signs in last 24 hours: Temp:  [98.3 F (36.8 C)-100.6 F (38.1 C)] 98.3 F (36.8 C) (09/13 0501) Pulse Rate:  [65-109] 69 (09/13 0501) Resp:  [13-23] 16 (09/13 0501) BP: (108-127)/(53-65) 120/61 (09/13 0501) SpO2:  [93 %-100 %] 94 % (09/13 0501) Weight:  [162 lb 4.1 oz (73.6 kg)] 162 lb 4.1 oz (73.6 kg) (09/12 1413) Last BM Date: 05/17/16 General:    white female in NAD, very uncomfortable Heart:  Regular rate and rhythm; no murmurs Lungs: Respirations even and unlabored, lungs CTA bilaterally Abdomen:  Soft, nontender and nondistended. Normal bowel sounds. Extremities:  Without edema. Neurologic:  Alert and oriented,  grossly normal neurologically. Psych:  Cooperative. Normal mood and affect.  Intake/Output from previous day: 09/12 0701 - 09/13 0700 In: 2281.3 [I.V.:2081.3; IV Piggyback:200] Out: -  Intake/Output this shift: Total I/O In: 240 [P.O.:240] Out: -   Lab Results:  Recent Labs  05/17/16 0905 05/17/16 0924 05/18/16 0600  WBC 1.6*  --  1.8*  HGB 12.4 12.2 11.3*  HCT 36.1 36.0 34.6*  PLT 124*  --  112*   BMET  Recent Labs  05/17/16 0905 05/17/16 0924  NA 137 139  K 3.4* 3.4*  CL 105 102  CO2 23  --   GLUCOSE 142* 138*  BUN 8 6  CREATININE 0.99 0.90  CALCIUM 8.5*  --    LFT  Recent Labs  05/17/16 0905 05/17/16 0907  PROT 6.8  --   ALBUMIN 3.9  --   AST 18  --   ALT 13*  --   ALKPHOS 102  --   BILITOT 1.5*  --   BILIDIR  --  0.3   PT/INR No results for input(s): LABPROT, INR in the last 72 hours.  Studies/Results: Dg Ribs Unilateral W/chest Right  Result Date: 05/17/2016 CLINICAL DATA:  Pt was sitting on  toilet this a.m and got lightheaded and fell against tub hitting rt lateral ribs, pain rt lateral ribs with some sob, no other chest complaints EXAM: RIGHT RIBS AND CHEST - 3+ VIEW COMPARISON:  07/14/2011 FINDINGS: Heart size is normal. The lungs are clear. There are no focal consolidations or pleural effusions. No pulmonary edema. Surgical clips are identified in the axillary regions and right upper quadrant. Oblique views of the ribs show an acute fracture of the right lateral eighth and ninth ribs. No pneumothorax. IMPRESSION: 1. Fracture of the right eighth and ninth ribs. 2. No pneumothorax or acute pulmonary abnormality. Electronically Signed   By: Nolon Nations M.D.   On: 05/17/2016 09:34   Ct Abdomen Pelvis W Contrast  Result Date: 05/17/2016 CLINICAL DATA:  Fall. EXAM: CT ABDOMEN AND PELVIS WITH CONTRAST TECHNIQUE: Multidetector CT imaging of the abdomen and pelvis was performed using the standard protocol following bolus administration of intravenous contrast. CONTRAST:  121mL ISOVUE-300 IOPAMIDOL (ISOVUE-300) INJECTION 61%, 80mL ISOVUE-300 IOPAMIDOL (ISOVUE-300) INJECTION 61% COMPARISON:  04/15/2013 FINDINGS: Lower chest: There is no pleural or pericardial effusion. Hepatobiliary: Exophytic cyst arising from segment 5 of the liver measures 1.3 cm in is unchanged from previous exam. Previous cholecystectomy. No common bile duct dilatation. Pancreas: Unremarkable.  No pancreatic ductal dilatation or surrounding inflammatory changes. Spleen: Normal in size without focal abnormality. Adrenals/Urinary Tract: Adrenal glands are unremarkable. Kidneys are normal, without renal calculi, focal lesion, or hydronephrosis. Bladder is unremarkable. Stomach/Bowel: The stomach is normal. The small bowel loops have a normal course and caliber. There is no bowel obstruction. Normal appearance of the terminal ileum. There is a 8.2 cm segment of abnormal proximal transverse colon. There is decrease in caliber of the  bowel within the segment and there is abnormal wall thickening/ edema which measures up to 8 mm. Separately, there is a segment of sigmoid colon which is abnormally thickened with surrounding inflammation. Wall thickness measures up to 1.3 cm, image 67 of series 2. There is diffuse surrounding inflammatory changes including fat stranding in hyperemia of the Vasa recta. No pathologic dilatation of the colon. There is no free air or fluid collections identified. Distal colonic diverticula noted. Vascular/Lymphatic: Calcified atherosclerotic disease involves the abdominal aorta. No aneurysm. No enlarged retroperitoneal or mesenteric adenopathy. No enlarged pelvic or inguinal lymph nodes. Reproductive: Uterus and bilateral adnexa are unremarkable. Other: There is no ascites or focal fluid collections within the abdomen or pelvis. Musculoskeletal: Degenerative disc disease identified within the lumbar spine. IMPRESSION: 1. There are 2 segments of abnormal: . The first segment involves the proximal transverse colon and measures approximately 8 cm. Here the bowel is decreased in caliber and there is abnormal wall thickening. The second segment involves the sigmoid colon were there is marked diffuse bowel wall thickening and inflammation. Findings are favored to reflect multifocal segmental colitis secondary to inflammatory bowel disease such as Crohn's disease. Less likely, inflammatory changes of the sigmoid colon may reflect diverticulitis with low grade inflammatory changes involving the proximal transverse colon secondary to inflammatory bowel disease. 2. No evidence for bowel obstruction, perforation or fistula formation. Electronically Signed   By: Kerby Moors M.D.   On: 05/17/2016 10:38       Assessment / Plan:    #1 70 yo WF with acute right 8 and 9th rib fractures after fall at home with syncope yesterday am  #2 pancytopenia- has had significant leukopenia- now plts trending down - needs bone marrow bx    Dr Jana Hakim has seen and bone marrow bx being rescheduled to [place] next week  #3  Abnormal CT of colon with segmental wall thickening of transverse colon and sigmoid  - this is incidental , unclear significance. No further diarrhea, has had low grade fevers On Cipro/Flagyl empirically Hematology feels Colonoscopy important so can move forward with dx Pt doesn't know whether she would rather get it overwith as inpt or wait and have it done outpt realizing she may be hurting from rib frx's for multiple weeks- may be easier to have done here where she can have assistance with prep etc.She and husband will discuss today  #4 fevers- needs repeat urine culture  Active Problems:   Breast cancer, right breast ([place])   Breast cancer, left breast ([place])   Cytopenia   Neutropenia ([place])   Syncope   Colitis   Leukopenia     LOS: 1 day   Onetta Spainhower  05/18/2016, 11:06 AM

## 2016-05-18 NOTE — Consult Note (Signed)
[facility]  [phone]) [phone] Fax:(336) [phone]     ID: Tammy Ewing DOB: 10-06-1945  MR#: [id number]  YSA#:[id number]  Patient Care Team: Caryl Bis, MD as PCP - General (Unknown Physician Specialty) PCP: Gar Ponto, MD Chauncey Cruel, MD GYN: SU:  OTHER MD: Gunnar Bulla MD  CHIEF COMPLAINT: cytopenias, diarrhea, uncontrolled pain  CURRENT TREATMENT: antibiotics, analgesics  RESEARCH PROTOCOL: no  HISTORY OF PRESENT ILLNESS: (from the 05/11/2016 intake note)  Was in her usual state of health when in course of a routine physical exam was found to be slightly leukopenic. Her primary care physician repeated her counts and they were even lower. Accordingly she was referred here for further evaluation and on 05/06/2016 we obtain a CBC and differential with other labs. The white cell count was 1.3. The neutrophil count was 0.1. The hemoglobin was 13.2. The MCV was 92.9. The platelet count was 139,000. Review of the blood film showed no significant abnormalities in the red cell series. Specifically there were no detail poikilocytes or nucleated red cells. The platelets appear unremarkable. In the white cell series there was no clear left shift, and no definite blasts. There were some atypical mononuclear cell some of which were consistent with large granular lymphocytes.  Other labs obtained the same day included a ferritin of 72, a vitamin B12 at 242, and RBC folate of 1030, a sedimentation rate of 6, and LDH of 123, and an ANA that was positive at 1:80.  Her subsequent history is as detailed below   INTERVAL HISTORY: Tammy Ewing felt faint yesterday, was heading to bed when she did towards to the bathroom and there she passed out, falling against the bathtub wall, cracking 2 ribs on the right side. She has had low-grade fever and some urinary symptoms. She was admitted, started on antibiotics, and because of complaints in the left lower extremity had  Dopplers done which were negative. Because of complaints regarding diarrhea, which is intermittent, she had a CT of the abdomen and pelvis which shows 2 areas of wall containing consistent with inflammation, possibly Crohn's disease, possibly diverticular disease, specifically involving the proximal transverse colon in the sigmoid. She was scheduled for bone marrow biopsy for her cytopenias today with the patient canceled the procedure because of recurrent pain.  REVIEW OF SYSTEMS: Tammy Ewing is lying in bed refusing to move because every time she moves she hurts she says. She tells me her diarrhea has stopped. She denies dysuria or hematuria at present. She has a little bit of a bruise on her for head from the fall but no significant headaches, no visual changes, no nausea or vomiting problems. However she has no appetite. She denies cough or phlegm production. Her husband is in the room.  Allergies  Allergen Reactions  . Pregabalin Other (See Comments)    "Hallucinations"  . Other Rash    bandaids     Current Facility-Administered Medications  Medication Dose Route Frequency Provider Last Rate Last Dose  . 0.9 %  sodium chloride infusion   Intravenous Continuous Margarita Mail, PA-C 125 mL/hr at 05/18/16 0421    . acetaminophen (TYLENOL) tablet 650 mg  650 mg Oral Q6H PRN Mercy Riding, MD   650 mg at 05/17/16 2343  . cefTRIAXone (ROCEPHIN) 1 g in dextrose 5 % 50 mL IVPB  1 g Intravenous Q24H Mercy Riding, MD   1 g at 05/18/16 0818  . chlorhexidine (PERIDEX) 0.12 % solution 15 mL  15 mL  Mouth/Throat BID Caren Griffins, MD   15 mL at 05/17/16 2338  . clonazePAM (KLONOPIN) tablet 0.5 mg  0.5 mg Oral QHS Mercy Riding, MD   0.5 mg at 05/17/16 2337  . enoxaparin (LOVENOX) injection 40 mg  40 mg Subcutaneous Q24H Mercy Riding, MD   40 mg at 05/17/16 2002  . [START ON 05/30/2016] fluorometholone (FML) 0.1 % ophthalmic suspension 1 drop  1 drop Both Eyes Q14 Days Mercy Riding, MD      . levothyroxine  (SYNTHROID, LEVOTHROID) tablet 75 mcg  75 mcg Oral QAC breakfast Mercy Riding, MD   75 mcg at 05/18/16 0818  . metoprolol succinate (TOPROL-XL) 24 hr tablet 50 mg  50 mg Oral Daily Mercy Riding, MD      . metroNIDAZOLE (FLAGYL) IVPB 500 mg  500 mg Intravenous Q8H Mercy Riding, MD   500 mg at 05/18/16 0421  . morphine 2 MG/ML injection 2 mg  2 mg Intravenous Q3H PRN Mercy Riding, MD   2 mg at 05/18/16 0818  . pantoprazole (PROTONIX) EC tablet 40 mg  40 mg Oral Daily Mercy Riding, MD        PAST MEDICAL HISTORY: Past Medical History:  Diagnosis Date  . Breast CA ([place])   . Breast cancer ([place]) 07/21/2011  . Breast cancer, left breast ([place]) 07/21/2011  . High cholesterol   . Hypertension   . Irritable bowel syndrome (IBS)     PAST SURGICAL HISTORY: Past Surgical History:  Procedure Laterality Date  . MASTECTOMY     bilateral    FAMILY HISTORY History reviewed. No pertinent family history. The patient has no information about her father. Her mother died at the age of 38. She has 2 half-brothers and 1 half-sister. There is no history of cancer in the family and specifically no history of hematologic problems that she is aware of   GENETICS TESTING: n/a  GYNECOLOGIC HISTORY:  Menarche age 73, first live birth age 56, she is GX P1. She stopped having periods at 89 and took hormone replacement approximately 8 years.  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 Her husband of more than 49 years, Delfino Lovett, used to work for [employer], but also is now retired. Their son Audry Pili is disabled secondary to multiple back surgeries. He lives in evening. The patient has one son and a 51-year-old great grandson. She is not a church attender although she is of the [place]: <no information>   HEALTH MAINTENANCE: Social History  Substance Use Topics  . Smoking status: Current Every Day Smoker    Packs/day: 1.00    Years: 50.00    Types: Cigarettes    . Smokeless tobacco: Never Used  . Alcohol use No    OBJECTIVE: Middle-aged white woman examined in bed Vitals:   05/17/16 2110 05/18/16 0501  BP: (!) 124/57 120/61  Pulse: 81 69  Resp: 18 16  Temp: (!) 100.6 F (38.1 C) 98.3 F (36.8 C)     Body mass index is 29.68 kg/m.     Ocular: Sclerae unicteric, EOMs intact Lymphatic: No cervical or supraclavicular adenopathy Lungs no rales or rhonchi, auscultated anterolaterally Heart regular rate and rhythm, no murmur appreciated Abd soft, nontender, positive bowel sounds, no masses palpated MSK tenderness right rib cage area to minimal touch Neuro: non-focal, well-oriented, appropriate affect Breasts: Deferred   LAB RESULTS:  CMP  Component Value Date/Time   NA 139 05/17/2016 0924   NA 142 05/06/2016 1159   K 3.4 (L) 05/17/2016 0924   K 4.1 05/06/2016 1159   CL 102 05/17/2016 0924   CL 107 01/10/2013 0905   CO2 23 05/17/2016 0905   CO2 26 05/06/2016 1159   GLUCOSE 138 (H) 05/17/2016 0924   GLUCOSE 91 05/06/2016 1159   GLUCOSE 101 (H) 01/10/2013 0905   BUN 6 05/17/2016 0924   BUN 6.8 (L) 05/06/2016 1159   CREATININE 0.90 05/17/2016 0924   CREATININE 0.8 05/06/2016 1159   CALCIUM 8.5 (L) 05/17/2016 0905   CALCIUM 9.1 05/06/2016 1159   PROT 6.8 05/17/2016 0905   PROT 6.6 05/06/2016 1159   ALBUMIN 3.9 05/17/2016 0905   ALBUMIN 3.6 05/06/2016 1159   AST 18 05/17/2016 0905   AST 15 05/06/2016 1159   ALT 13 (L) 05/17/2016 0905   ALT 10 05/06/2016 1159   ALKPHOS 102 05/17/2016 0905   ALKPHOS 125 05/06/2016 1159   BILITOT 1.5 (H) 05/17/2016 0905   BILITOT 0.58 05/06/2016 1159   GFRNONAA 56 (L) 05/17/2016 0905   GFRAA >60 05/17/2016 0905    No results found for: KPAFRELGTCHN, LAMBDASER, KAPLAMBRATIO  No results found for: TOTALPROTELP, ALBUMINELP, A1GS, A2GS, BETS, BETA2SER, GAMS, MSPIKE, SPEI  Lab Results  Component Value Date   WBC 1.8 (L) 05/18/2016   NEUTROABS 0.5 (L) 05/17/2016   HGB 11.3 (L)  05/18/2016   HCT 34.6 (L) 05/18/2016   MCV 94.0 05/18/2016   PLT 112 (L) 05/18/2016    _0 @  Lab Results  Component Value Date   LABCA2 15 01/09/2012    No components found for: [id number]  No results for input(s): INR in the last 168 hours.  Urinalysis    Component Value Date/Time   COLORURINE ORANGE (A) 05/17/2016 0845   APPEARANCEUR CLOUDY (A) 05/17/2016 0845   LABSPEC 1.037 (H) 05/17/2016 0845   PHURINE 6.0 05/17/2016 0845   GLUCOSEU 100 (A) 05/17/2016 0845   HGBUR TRACE (A) 05/17/2016 0845   BILIRUBINUR MODERATE (A) 05/17/2016 0845   KETONESUR NEGATIVE 05/17/2016 0845   PROTEINUR 100 (A) 05/17/2016 0845   UROBILINOGEN 1.0 04/15/2013 0446   NITRITE POSITIVE (A) 05/17/2016 0845   LEUKOCYTESUR SMALL (A) 05/17/2016 0845    RADIOLOGY AND OTHER STUDIES: Dg Ribs Unilateral W/chest Right  Result Date: 05/17/2016 CLINICAL DATA:  Pt was sitting on toilet this a.m and got lightheaded and fell against tub hitting rt lateral ribs, pain rt lateral ribs with some sob, no other chest complaints EXAM: RIGHT RIBS AND CHEST - 3+ VIEW COMPARISON:  07/14/2011 FINDINGS: Heart size is normal. The lungs are clear. There are no focal consolidations or pleural effusions. No pulmonary edema. Surgical clips are identified in the axillary regions and right upper quadrant. Oblique views of the ribs show an acute fracture of the right lateral eighth and ninth ribs. No pneumothorax. IMPRESSION: 1. Fracture of the right eighth and ninth ribs. 2. No pneumothorax or acute pulmonary abnormality. Electronically Signed   By: Nolon Nations M.D.   On: 05/17/2016 09:34   Ct Abdomen Pelvis W Contrast  Result Date: 05/17/2016 CLINICAL DATA:  Fall. EXAM: CT ABDOMEN AND PELVIS WITH CONTRAST TECHNIQUE: Multidetector CT imaging of the abdomen and pelvis was performed using the standard protocol following bolus administration of intravenous contrast. CONTRAST:  126m ISOVUE-300 IOPAMIDOL (ISOVUE-300)  INJECTION 61%, 38mISOVUE-300 IOPAMIDOL (ISOVUE-300) INJECTION 61% COMPARISON:  04/15/2013 FINDINGS: Lower chest: There is no pleural or pericardial effusion.  Hepatobiliary: Exophytic cyst arising from segment 5 of the liver measures 1.3 cm in is unchanged from previous exam. Previous cholecystectomy. No common bile duct dilatation. Pancreas: Unremarkable. No pancreatic ductal dilatation or surrounding inflammatory changes. Spleen: Normal in size without focal abnormality. Adrenals/Urinary Tract: Adrenal glands are unremarkable. Kidneys are normal, without renal calculi, focal lesion, or hydronephrosis. Bladder is unremarkable. Stomach/Bowel: The stomach is normal. The small bowel loops have a normal course and caliber. There is no bowel obstruction. Normal appearance of the terminal ileum. There is a 8.2 cm segment of abnormal proximal transverse colon. There is decrease in caliber of the bowel within the segment and there is abnormal wall thickening/ edema which measures up to 8 mm. Separately, there is a segment of sigmoid colon which is abnormally thickened with surrounding inflammation. Wall thickness measures up to 1.3 cm, image 67 of series 2. There is diffuse surrounding inflammatory changes including fat stranding in hyperemia of the Vasa recta. No pathologic dilatation of the colon. There is no free air or fluid collections identified. Distal colonic diverticula noted. Vascular/Lymphatic: Calcified atherosclerotic disease involves the abdominal aorta. No aneurysm. No enlarged retroperitoneal or mesenteric adenopathy. No enlarged pelvic or inguinal lymph nodes. Reproductive: Uterus and bilateral adnexa are unremarkable. Other: There is no ascites or focal fluid collections within the abdomen or pelvis. Musculoskeletal: Degenerative disc disease identified within the lumbar spine. IMPRESSION: 1. There are 2 segments of abnormal: . The first segment involves the proximal transverse colon and measures  approximately 8 cm. Here the bowel is decreased in caliber and there is abnormal wall thickening. The second segment involves the sigmoid colon were there is marked diffuse bowel wall thickening and inflammation. Findings are favored to reflect multifocal segmental colitis secondary to inflammatory bowel disease such as Crohn's disease. Less likely, inflammatory changes of the sigmoid colon may reflect diverticulitis with low grade inflammatory changes involving the proximal transverse colon secondary to inflammatory bowel disease. 2. No evidence for bowel obstruction, perforation or fistula formation. Electronically Signed   By: Kerby Moors M.D.   On: 05/17/2016 10:38      ASSESSMENT: 70 y.o. admitted after syncopal episode, with a history of unexplained leukopenia, other issues being fever, diarrhea, pain from R sided rib trauma secondary to fall, left leg pain, workup so far showing  (a) dopplers L LE 05/17/2016 negative  (b) CT abd./pelvis 05/17/2016 showing 2 areas (proximal transverse colon, sigmoid) c/w inflammation  (c) bone marrow biopsy scheduled 05/18/2016 postponed by patient because of rib pain  PLAN: I can't easily put together the GI findings and the cytopenias. I am concerned that her platelet count is dropping, which points more towards a primary bone marrow process (as opposed to an autoimmune isolated cytopenia)  We are going to need that bone marrow biopsy and a colonoscopy to sort things out. Today the patient feels her pain is not controlled, she hurts every time she moves, and she does not feel she can undergo any procedures under those circumstances. On the plus side the diarrhea has stopped.  I anticipate her pain will be controlled on oral agents she can continue at home and she can then be discharged--would continue flagyl and cipro as outpatient. I will try to reschedule her BMBX for early next week as outpatient. Would check with GI when they plan to proceed to  colonoscopy.  She has an appointment with me OCT 3 and hopefully we will have results by then and can contemplate starting appropriate treatment.  Appreciate  your help to this patient!    Chauncey Cruel, MD   05/18/2016 8:47 AM Medical Oncology and Hematology [facility] [address] [place], [place] [address] Tel. [phone]    Fax. [phone]

## 2016-05-18 NOTE — Care Management Note (Signed)
Case Management Note  Patient Details  Name: Tammy Ewing MRN: [id number] Date of Birth: 11-13-1945  Subjective/Objective:       Gi infection versus crohns flare, wbc 1.8,temp 100.8             Action/Plan: home when stable   Expected Discharge Date:   (unknown)               Expected Discharge Plan:  Home/Self Care  In-House Referral:  NA  Discharge planning Services  NA  Post Acute Care Choice:  NA Choice offered to:  NA  DME Arranged:    DME Agency:     HH Arranged:    HH Agency:     Status of Service:  In process, will continue to follow  If discussed at Long Length of Stay Meetings, dates discussed:    Additional Comments:  [name], BSN,RN3,[phone]/will follow for needs and dc plan.  [name], RN 05/18/2016, 9:56 AM

## 2016-05-19 DIAGNOSIS — N390 Urinary tract infection, site not specified: Secondary | ICD-10-CM

## 2016-05-19 DIAGNOSIS — D708 Other neutropenia: Secondary | ICD-10-CM

## 2016-05-19 DIAGNOSIS — A419 Sepsis, unspecified organism: Principal | ICD-10-CM

## 2016-05-19 LAB — CBC
HCT: 33.8 % — ABNORMAL LOW (ref 36.0–46.0)
HEMOGLOBIN: 11.2 g/dL — AB (ref 12.0–15.0)
MCH: 31.4 pg (ref 26.0–34.0)
MCHC: 33.1 g/dL (ref 30.0–36.0)
MCV: 94.7 fL (ref 78.0–100.0)
PLATELETS: 109 10*3/uL — AB (ref 150–400)
RBC: 3.57 MIL/uL — ABNORMAL LOW (ref 3.87–5.11)
RDW: 13.8 % (ref 11.5–15.5)
WBC: 1.7 10*3/uL — ABNORMAL LOW (ref 4.0–10.5)

## 2016-05-19 LAB — BASIC METABOLIC PANEL
Anion gap: 6 (ref 5–15)
CO2: 23 mmol/L (ref 22–32)
CREATININE: 0.71 mg/dL (ref 0.44–1.00)
Calcium: 7.7 mg/dL — ABNORMAL LOW (ref 8.9–10.3)
Chloride: 110 mmol/L (ref 101–111)
GFR calc Af Amer: >60 mL/min (ref >60)
GLUCOSE: 96 mg/dL (ref 65–99)
Potassium: 3.7 mmol/L (ref 3.5–5.1)
Sodium: 139 mmol/L (ref 135–145)

## 2016-05-19 LAB — GLUCOSE, CAPILLARY: GLUCOSE-CAPILLARY: 95 mg/dL (ref 65–99)

## 2016-05-19 NOTE — Progress Notes (Signed)
PROGRESS NOTE    Tammy Ewing  ZTI:[id number] DOB: [birth year] DOA: 05/17/2016 PCP: Gar Ponto, MD   Chief Complaint  Patient presents with  . Loss of Consciousness    Brief Narrative:  HPI on 05/17/2016 by Dr. Wendee Beavers (resident) Tammy Ewing is a 70 y.o. female with medical history significant of breast cancer s/p double mastectomy, implant and removal of implant, (no chmo or radiation), now in remission after 4 years of tamoxifen, also cytopenia, hypertension and HLD.  Patient was in her usual state of health until last night when she had mild fever. She didn't check her temperature. She went to bed and woke up this morning about 7 am and felt woozy when she went to bathroom. She reports having diarrhea that she describes as loose stool. She denies blood in stool. Then she fell of the comode and ended up on the floor on her right side. When she regained her consciousness, her husband was on the phone calling 911. She denies postictal symptoms, speech problem, focal weakness or facial drooping.  She denies chest pain, shortness of breath, palpitation, nausea, vomiting, dysuria, hematuria, back or flank pain. She is now hurting where she broke her ribs after fall. She denies night sweats or unexplained weight loss. She has left leg cramping for the last few days not related to exertion. Denies leg swelling.  Patient denies personal or family history of cardiac disease. She doesn't know about her father side of family. Patient sees Dr. Jana Hakim (oncology) for breast cancer and cytopenia. She says there is a plan for bone biopsy tomorrow (9/13).  She reports smoking about a pack a day but not interested in nicotine patches. Denies drinking EtOH or drug use.   Assessment & Plan   Syncope -Likely vasovagal from GI losses -PT consulted and pending  Sepsis secondary to UTI/colilitis -Patient continues to spike fevers.  -Currently WBC 1.7 -Treating UTI and possible  Colitis -Continue to monitor. Continue Tylenol PRN -Blood cultures pending  UTI -Urine culture shows multiple species -Currently on ciprofloxacin  Thrombocytopenia/Luekopenia -Unknown etiology  -Oncology consulted and appreciated.  Patient was supposed to have bone marrow biopsy today, however will be rescheduled- next week -Follow up with Dr. Jana Hakim on Oct 3.  Rib fracture -Secondary to syncopal event/fall -8th and 9th right ribs -Continue pain control -Added muscle relaxer  IBD/colitis -Gastroenterology consulted and appreciated -Will likely need colonoscopy as an outpatient -CT showed segmental wall thickening in the transverse and sigmoid colon, question whether this is incidental finding -Patient a longer having diarrhea -Placed on ciprofloxacin and Flagyl empirically -GI pathogen panel ordered, however, no further bowel movements  -Plan for outpatient colonoscopy with Dr. Havery Moros  Essential hypertension -continue metoprolol  Hypothyroidism -Continue synthroid  History of breast cancer -S/p bilateral mastectomy, implant, and removal -Treated with tamoxifen  Tobacco Abuse disorder -Smoking cessation discussed -Declined tobacco abuse  Insomnia -Continue Klonopin  DVT Prophylaxis  Lovenox  Code Status: Full   Family Communication: Husband at bedside.  Disposition Plan: Admitted. Continue pain control. Pending PT.  Likely discharge to home in 24-48hours.  Consultants Oncology Gastroenterology  Procedures  None  Antibiotics   Anti-infectives    Start     Dose/Rate Route Frequency Ordered Stop   05/18/16 0900  cefTRIAXone (ROCEPHIN) 1 g in dextrose 5 % 50 mL IVPB     1 g 100 mL/hr over 30 Minutes Intravenous Every 24 hours 05/17/16 1418     05/17/16 2000  metroNIDAZOLE (FLAGYL) IVPB 500  mg     500 mg 100 mL/hr over 60 Minutes Intravenous Every 8 hours 05/17/16 1424     05/17/16 1115  ciprofloxacin (CIPRO) IVPB 400 mg     400 mg 200 mL/hr over  60 Minutes Intravenous  Once 05/17/16 1103 05/17/16 1322   05/17/16 1115  metroNIDAZOLE (FLAGYL) IVPB 500 mg     500 mg 100 mL/hr over 60 Minutes Intravenous  Once 05/17/16 1103 05/17/16 1322   05/17/16 0930  cefTRIAXone (ROCEPHIN) 1 g in dextrose 5 % 50 mL IVPB     1 g 100 mL/hr over 30 Minutes Intravenous  Once 05/17/16 0916 05/17/16 1147      Subjective:   Tammy Ewing seen and examined today.  Continues to have right sided rib pain, worse with deep inhalation and movement. Has poor appetite, and per husband has been this way for several weeks. Denies chest pain, shortness of breath, nausea/vomiting, dizziness, headache.  Objective:   Vitals:   05/18/16 0501 05/18/16 1532 05/18/16 2023 05/19/16 0719  BP: 120/61 (!) 116/54 (!) 144/65 (!) 147/68  Pulse: 69 65 82 72  Resp: _0 Temp: 98.3 F (36.8 C) 98.2 F (36.8 C) (!) 101.3 F (38.5 C) 99.1 F (37.3 C)  TempSrc: Oral Oral Oral Oral  SpO2: 94%  95% 98%  Weight:      Height:        Intake/Output Summary (Last 24 hours) at 05/19/16 1127 Last data filed at 05/19/16 0900  Gross per 24 hour  Intake          4098.75 ml  Output              750 ml  Net          3348.75 ml   Filed Weights   05/17/16 0840 05/17/16 1413  Weight: 71.7 kg (158 lb) 73.6 kg (162 lb 4.1 oz)    Exam  General: Well developed, well nourished, no distress  HEENT: NCAT, mucous membranes moist.   Cardiovascular: S1 S2 auscultated, RRR, no murmurs  Respiratory: Clear to auscultation bilaterally with equal chest rise  Abdomen: Soft, nontender, nondistended, + bowel sounds  Extremities: warm dry without cyanosis clubbing or edema  Neuro: AAOx3, nonfocal  Psych: Appropriate mood and affect, pleasant   Data Reviewed: I have personally reviewed following labs and imaging studies  CBC:  Recent Labs Lab 05/17/16 0905 05/17/16 0924 05/18/16 0600 05/19/16 0528  WBC 1.6*  --  1.8* 1.7*  NEUTROABS 0.5*  --   --   --   HGB 12.4  12.2 11.3* 11.2*  HCT 36.1 36.0 34.6* 33.8*  MCV 91.2  --  94.0 94.7  PLT 124*  --  112* 283*   Basic Metabolic Panel:  Recent Labs Lab 05/17/16 0905 05/17/16 0924 05/19/16 0528  NA 137 139 139  K 3.4* 3.4* 3.7  CL 105 102 110  CO2 23  --  23  GLUCOSE 142* 138* 96  BUN 8 6 <5*  CREATININE 0.99 0.90 0.71  CALCIUM 8.5*  --  7.7*   GFR: Estimated Creatinine Clearance: 60.6 mL/min (by C-G formula based on SCr of 0.71 mg/dL). Liver Function Tests:  Recent Labs Lab 05/17/16 0905  AST 18  ALT 13*  ALKPHOS 102  BILITOT 1.5*  PROT 6.8  ALBUMIN 3.9    Recent Labs Lab 05/17/16 0905  LIPASE 16   No results for input(s): AMMONIA in the last 168 hours. Coagulation Profile: No results for input(s):  INR, PROTIME in the last 168 hours. Cardiac Enzymes:  Recent Labs Lab 05/17/16 0907  CKTOTAL 19*   BNP (last 3 results) No results for input(s): PROBNP in the last 8760 hours. HbA1C: No results for input(s): HGBA1C in the last 72 hours. CBG:  Recent Labs Lab 05/17/16 0854 05/18/16 0738 05/19/16 0732  GLUCAP 148* 106* 95   Lipid Profile: No results for input(s): CHOL, HDL, LDLCALC, TRIG, CHOLHDL, LDLDIRECT in the last 72 hours. Thyroid Function Tests:  Recent Labs  05/17/16 1417  TSH 1.839   Anemia Panel: No results for input(s): VITAMINB12, FOLATE, FERRITIN, TIBC, IRON, RETICCTPCT in the last 72 hours. Urine analysis:    Component Value Date/Time   COLORURINE ORANGE (A) 05/17/2016 0845   APPEARANCEUR CLOUDY (A) 05/17/2016 0845   LABSPEC 1.037 (H) 05/17/2016 0845   PHURINE 6.0 05/17/2016 0845   GLUCOSEU 100 (A) 05/17/2016 0845   HGBUR TRACE (A) 05/17/2016 0845   BILIRUBINUR MODERATE (A) 05/17/2016 0845   KETONESUR NEGATIVE 05/17/2016 0845   PROTEINUR 100 (A) 05/17/2016 0845   UROBILINOGEN 1.0 04/15/2013 0446   NITRITE POSITIVE (A) 05/17/2016 0845   LEUKOCYTESUR SMALL (A) 05/17/2016 0845   Sepsis  Labs: _0 (procalcitonin:4,lacticidven:4)  ) Recent Results (from the past 240 hour(s))  Urine culture     Status: Abnormal   Collection Time: 05/17/16  8:45 AM  Result Value Ref Range Status   Specimen Description URINE, CLEAN CATCH  Final   Special Requests NONE  Final   Culture MULTIPLE SPECIES PRESENT, SUGGEST RECOLLECTION (A)  Final   Report Status 05/18/2016 FINAL  Final      Radiology Studies: No results found.   Scheduled Meds: . cefTRIAXone (ROCEPHIN) IVPB 1 gram/50 mL D5W  1 g Intravenous Q24H  . chlorhexidine  15 mL Mouth/Throat BID  . clonazePAM  0.5 mg Oral QHS  . enoxaparin (LOVENOX) injection  40 mg Subcutaneous Q24H  . [START ON 05/30/2016] fluorometholone  1 drop Both Eyes Q14 Days  . levothyroxine  75 mcg Oral QAC breakfast  . metoprolol succinate  50 mg Oral Daily  . metronidazole  500 mg Intravenous Q8H  . pantoprazole  40 mg Oral Daily   Continuous Infusions: . sodium chloride 125 mL/hr at 05/19/16 0855     LOS: 2 days   Time Spent in minutes   30 minutes  Princesa Willig D.O. on 05/19/2016 at 11:27 AM  Between 7am to 7pm - Pager - [phone]  After 7pm go to [email or web address] - password [email or web address]  And look for the night coverage person covering for me after hours  [facility] Office  [phone]

## 2016-05-19 NOTE — Evaluation (Signed)
Physical Therapy Evaluation Patient Details Name: Tammy Ewing MRN: [id number] DOB: [birth year] Today's Date: 05/19/2016   History of Present Illness  Pt is a 70 y/o female with PMHx of breast cancer s/p double mastectomy, cytopenia, hypertension and HLD; admitted from home after fall on 05/17/16 and fractured R ribs (8 and 9).   Clinical Impression  Pt admitted after fall resulting in fractured ribs on 05/17/16. Pt currently with functional limitations due to the deficits listed below (see PT Problem List). Pt will benefit from skilled PT to increase her independence and safety with mobility to allow discharge. Pt moved slowly when walking in hallway due to rib pain and generalized weakness. Pt was initially hesitant to use RW but then agreed to use it for balance and safety. Pt is asking son to check if he has a RW she could use once discharged home.     Follow Up Recommendations Supervision for mobility/OOB;Home health PT    Equipment Recommendations  Rolling walker with 5" wheels    Recommendations for Other Services       Precautions / Restrictions Precautions Precautions: Fall Restrictions Weight Bearing Restrictions: No      Mobility  Bed Mobility Overal bed mobility: Needs Assistance Bed Mobility: Supine to Sit     Supine to sit: Min guard     General bed mobility comments: pt required guarding for safety and extra time due to weakness and pain  Transfers Overall transfer level: Needs assistance Equipment used: Rolling walker (2 wheeled) Transfers: Sit to/from Stand Sit to Stand: Min guard         General transfer comment: required guarding and verbal cues for safety  Ambulation/Gait Ambulation/Gait assistance: Min guard Ambulation Distance (Feet): 120 Feet Assistive device: Rolling walker (2 wheeled) Gait Pattern/deviations: Step-through pattern;Trunk flexed     General Gait Details: pt moved slowly due to weakness and pain; required guarding and  verbal cues for safety  Stairs            Wheelchair Mobility    Modified Rankin (Stroke Patients Only)       Balance                                             Pertinent Vitals/Pain Pain Assessment: Faces Faces Pain Scale: Hurts little more Pain Location: R ribs (8-9) Pain Descriptors / Indicators: Aching;Discomfort;Sore Pain Intervention(s): Limited activity within patient's tolerance;Monitored during session;Repositioned    Home Living Family/patient expects to be discharged to:: Private residence Living Arrangements: Spouse/significant other Available Help at Discharge: Family Type of Home: House Home Access: Stairs to enter   Technical brewer of Steps: 2 Home Layout: One level Home Equipment: None      Prior Function Level of Independence: Independent               Hand Dominance        Extremity/Trunk Assessment               Lower Extremity Assessment: Generalized weakness         Communication      Cognition Arousal/Alertness: Awake/alert Behavior During Therapy: WFL for tasks assessed/performed Overall Cognitive Status: Within Functional Limits for tasks assessed                      General Comments      Exercises  Assessment/Plan    PT Assessment Patient needs continued PT services  PT Diagnosis Generalized weakness;Difficulty walking   PT Problem List Decreased strength;Decreased mobility  PT Treatment Interventions Gait training;DME instruction;Stair training;Functional mobility training;Therapeutic activities;Therapeutic exercise;Patient/family education   PT Goals (Current goals can be found in the Care Plan section) Acute Rehab PT Goals PT Goal Formulation: With patient Time For Goal Achievement: 05/26/16 Potential to Achieve Goals: Good    Frequency Min 3X/week   Barriers to discharge        Co-evaluation               End of Session Equipment Utilized  During Treatment: Gait belt Activity Tolerance: Patient tolerated treatment well Patient left: in chair;with call bell/phone within reach;with chair alarm set           Time: [id number] PT Time Calculation (min) (ACUTE ONLY): 15 min   Charges:   PT Evaluation $PT Eval Low Complexity: 1 Procedure     PT G CodesDewitt Hoes June 12, 2016, 1:51 PM Dewitt Hoes, SPT

## 2016-05-19 NOTE — Progress Notes (Signed)
Patient ID: Tammy Ewing, female   DOB: 09/28/1945, 70 y.o.   MRN: [id number]  Pt feeling a bit better today, pain not quite as severe She has decided to have Colonoscopy done as an outpatient with [name]. Bone marrow Bx is to be done next week so will plan for the following week [name] nurse will contact her at home next week to schedule etc

## 2016-05-20 ENCOUNTER — Telehealth: Payer: Self-pay | Admitting: *Deleted

## 2016-05-20 ENCOUNTER — Other Ambulatory Visit: Payer: Self-pay | Admitting: Oncology

## 2016-05-20 DIAGNOSIS — D759 Disease of blood and blood-forming organs, unspecified: Secondary | ICD-10-CM

## 2016-05-20 DIAGNOSIS — C50912 Malignant neoplasm of unspecified site of left female breast: Secondary | ICD-10-CM

## 2016-05-20 LAB — BASIC METABOLIC PANEL
ANION GAP: 8 (ref 5–15)
BUN: <5 mg/dL — ABNORMAL LOW (ref 6–20)
CALCIUM: 7.7 mg/dL — AB (ref 8.9–10.3)
CO2: 21 mmol/L — AB (ref 22–32)
Chloride: 109 mmol/L (ref 101–111)
Creatinine, Ser: 0.6 mg/dL (ref 0.44–1.00)
GFR calc non Af Amer: >60 mL/min (ref >60)
GLUCOSE: 88 mg/dL (ref 65–99)
POTASSIUM: 3.3 mmol/L — AB (ref 3.5–5.1)
Sodium: 138 mmol/L (ref 135–145)

## 2016-05-20 LAB — CBC
HEMATOCRIT: 32.8 % — AB (ref 36.0–46.0)
HEMOGLOBIN: 11 g/dL — AB (ref 12.0–15.0)
MCH: 30.4 pg (ref 26.0–34.0)
MCHC: 33.5 g/dL (ref 30.0–36.0)
MCV: 90.6 fL (ref 78.0–100.0)
Platelets: 114 10*3/uL — ABNORMAL LOW (ref 150–400)
RBC: 3.62 MIL/uL — AB (ref 3.87–5.11)
RDW: 13.4 % (ref 11.5–15.5)
WBC: 1.3 10*3/uL — AB (ref 4.0–10.5)

## 2016-05-20 MED ORDER — METRONIDAZOLE 500 MG PO TABS: 500.0000 mg | ORAL_TABLET | Freq: Three times a day (TID) | ORAL | 0 refills | Status: AC

## 2016-05-20 MED ORDER — METRONIDAZOLE 500 MG PO TABS: 500.0000 mg | ORAL_TABLET | Freq: Three times a day (TID) | ORAL | 0 refills | Status: DC

## 2016-05-20 MED ORDER — CYCLOBENZAPRINE HCL 5 MG PO TABS: 5.0000 mg | ORAL_TABLET | Freq: Three times a day (TID) | ORAL | 0 refills | Status: DC | PRN

## 2016-05-20 MED ORDER — POTASSIUM CHLORIDE CRYS ER 20 MEQ PO TBCR
40.0000 meq | EXTENDED_RELEASE_TABLET | Freq: Two times a day (BID) | ORAL | Status: DC
  Administered 2016-05-20: 40 meq via ORAL
  Filled 2016-05-20: qty 2

## 2016-05-20 MED ORDER — CIPROFLOXACIN HCL 500 MG PO TABS: 500.0000 mg | ORAL_TABLET | Freq: Two times a day (BID) | ORAL | 0 refills | Status: AC

## 2016-05-20 MED ORDER — CIPROFLOXACIN HCL 500 MG PO TABS: 500.0000 mg | ORAL_TABLET | Freq: Two times a day (BID) | ORAL | 0 refills | Status: DC

## 2016-05-20 MED ORDER — CYCLOBENZAPRINE HCL 5 MG PO TABS: 5.0000 mg | ORAL_TABLET | Freq: Three times a day (TID) | ORAL | 0 refills | Status: AC | PRN

## 2016-05-20 MED ORDER — OXYCODONE HCL 5 MG PO TABS: 5.0000 mg | ORAL_TABLET | ORAL | 0 refills | Status: AC | PRN

## 2016-05-20 NOTE — Progress Notes (Signed)
CRITICAL VALUE ALERT  Critical value received:  WBC 1.3  Date of notification:  05/20/2016   Time of notification:  7:06 AM   Critical value read back:Yes.    Nurse who received alert:  Jeanie Sewer   MD notified (1st page): MD M lkram  Time of first page:  7:07 AM   MD notified (2nd page):  Time of second page:  Responding MD:    Time MD responded:

## 2016-05-20 NOTE — Progress Notes (Signed)
Physical Therapy Treatment Patient Details Name: Tammy Ewing MRN: [id number] DOB: November 16, 1945 Today's Date: 05/20/2016    History of Present Illness Pt is a 70 y/o female with PMHx of breast cancer s/p double mastectomy, cytopenia, hypertension and HLD; admitted from home after fall on 05/17/16 and fractured R ribs (8 and 9).     PT Comments    Pt progressing well with mobility, she ambulated 250' with RW.   Follow Up Recommendations  Supervision for mobility/OOB;Home health PT     Equipment Recommendations  Rolling walker with 5" wheels    Recommendations for Other Services       Precautions / Restrictions Precautions Precautions: Fall Restrictions Weight Bearing Restrictions: No    Mobility  Bed Mobility Overal bed mobility: Needs Assistance Bed Mobility: Supine to Sit     Supine to sit: Min guard;HOB elevated     General bed mobility comments: pt required guarding for safety and extra time due to weakness and pain  Transfers Overall transfer level: Needs assistance Equipment used: Rolling walker (2 wheeled) Transfers: Sit to/from Stand Sit to Stand: Min guard         General transfer comment: required guarding and verbal cues for safety  Ambulation/Gait Ambulation/Gait assistance: Min guard Ambulation Distance (Feet): 250 Feet Assistive device: Rolling walker (2 wheeled) Gait Pattern/deviations: Decreased stride length   Gait velocity interpretation: Below normal speed for age/gender General Gait Details: pt moved slowly due to weakness and pain   Stairs            Wheelchair Mobility    Modified Rankin (Stroke Patients Only)       Balance Overall balance assessment: Needs assistance   Sitting balance-Leahy Scale: Good       Standing balance-Leahy Scale: Fair                      Cognition Arousal/Alertness: Awake/alert Behavior During Therapy: WFL for tasks assessed/performed Overall Cognitive Status: Within  Functional Limits for tasks assessed                      Exercises      General Comments        Pertinent Vitals/Pain Pain Assessment: 0-10 Pain Score: 9  Pain Location: R ribs 8-9 Pain Descriptors / Indicators: Sore Pain Intervention(s): Limited activity within patient's tolerance;Monitored during session (pt declined meds)    Home Living                      Prior Function            PT Goals (current goals can now be found in the care plan section) Acute Rehab PT Goals PT Goal Formulation: With patient Time For Goal Achievement: 05/26/16 Potential to Achieve Goals: Good Progress towards PT goals: Progressing toward goals    Frequency   PT Diagnosis  Min 3X/week   Decreased ambulation status  Neutropenia, unspecified type (HCC)  Leukopenia  UTI (lower urinary tract infection)  Colitis  Superficial thrombophlebitis  Thrombocytopenia (HCC)  Rib fracture, right, closed, initial encounter  Cytopenia - Plan: CANCELED: CT BIOPSY, CANCELED: CT BIOPSY     PT Plan Current plan remains appropriate    Co-evaluation             End of Session Equipment Utilized During Treatment: Gait belt Activity Tolerance: Patient tolerated treatment well Patient left: in chair;with call bell/phone within reach;with chair alarm set  Time: 1235-1250 PT Time Calculation (min) (ACUTE ONLY): 15 min  Charges:  $Gait Training: 8-22 mins                    G Codes:      Tammy Ewing 05/20/2016, 1:26 PM [phone]

## 2016-05-20 NOTE — Discharge Summary (Signed)
Discharge Summary  Tammy Ewing [id number] DOB: [birth year]  PCP: Gar Ponto, MD  Admit date: 05/17/2016 Discharge date: 05/20/2016   Recommendations for Outpatient Follow-up:  1. Dr. Jana Hakim on Oct 3, will need BM biopsy. 2. Dr. Havery Moros for colonoscopy as outpatient.   Discharge Diagnoses:  Active Hospital Problems   Diagnosis Date Noted  . Syncope 05/17/2016  . Colitis   . Leukopenia   . Neutropenia ([place]) 05/11/2016  . Cytopenia 05/06/2016  . Breast cancer, right breast ([place]) 07/21/2011  . Breast cancer, left breast ([place]) 07/21/2011    Resolved Hospital Problems   Diagnosis Date Noted Date Resolved  No resolved problems to display.    Discharge Condition: Stable   Diet recommendation: Regular   Vitals:   05/20/16 1016 05/20/16 1354  BP: (!) 136/59 121/64  Pulse: 72 63  Resp:  17  Temp:  98.4 F (36.9 C)    History of present illness:  Tammy Saw Hamptonis a 70 y.o.femalewith medical history significant of breast cancer s/p double mastectomy, implant and removal of implant, (no chmo or radiation), now in remission after 4 years of tamoxifen, also cytopenia, hypertension and HLD.  Patient was in her usual state of health until last night when she had mild fever. She didn't check her temperature. She went to bed and woke up this morning about 7 am and felt woozy when she went to bathroom. She reports having diarrhea that she describes as loose stool. She denies blood in stool. Then she fell of the comode and ended up on the floor on her right side. When she regained her consciousness, her husband was on the phone calling 911. She denies postictal symptoms, speech problem, focal weakness or facial drooping.  She denies chest pain, shortness of breath, palpitation, nausea, vomiting, dysuria, hematuria, back or flank pain. She is now hurting where she broke her ribs after fall. She denies night sweats or unexplained weight loss. She has left leg cramping for  the last few days not related to exertion. Denies leg swelling.  Patient denies personal or family history of cardiac disease. She doesn't know about her father side of family. Patient sees Dr. Jana Hakim (oncology) for breast cancer and cytopenia. She says there is a plan for bone biopsy 9/13. She reports smoking about a pack a day but not interested in nicotine patches. Denies drinking EtOH or drug use.    Hospital Course:  Active Problems:   Breast cancer, right breast (HCC)   Breast cancer, left breast (HCC)   Cytopenia   Neutropenia (HCC)   Syncope   Colitis   Leukopenia  Syncope -Likely vasovagal from GI losses -PT consulted and recommends home health PT  Sepsis secondary to UTI/colilitis -Patient has been afebrile over 24 hours -Treating UTI and possible Colitis -Continue to monitor. Continue Tylenol PRN -Blood cultures pending - GI recommends complete course of Flagyl/Cipro  UTI -Urine culture shows multiple species -Currently on ciprofloxacin  Thrombocytopenia/Luekopenia -Unknown etiology  -Oncology consulted and appreciated.  Will have BM bx next week. -Follow up with Dr. Jana Hakim on Oct 3.  Rib fracture -Secondary to syncopal event/fall -8th and 9th right ribs -Continue pain control -Added muscle relaxer and narcotic pain medication for home  IBD/colitis -Gastroenterology consulted and appreciated -Will likely need colonoscopy as an outpatient -CT showed segmental wall thickening in the transverse and sigmoid colon, question whether this is incidental finding -Patient is no longer having diarrhea -Placed on ciprofloxacin and Flagyl empirically -GI pathogen panel ordered, however, no  further bowel movements  -Plan for outpatient colonoscopy with Dr. Havery Moros  Essential hypertension -continue metoprolol  Hypothyroidism -Continue synthroid  History of breast cancer -S/p bilateral mastectomy, implant, and removal -Treated with  tamoxifen  Tobacco Abuse disorder -Smoking cessation discussed -Declined tobacco abuse  Insomnia -Continue Klonopin  Procedures:  None   Consultations:  GI  Oncology   Discharge Exam: BP 121/64 (BP Location: Right Arm)   Pulse 63   Temp 98.4 F (36.9 C) (Oral)   Resp 17   Ht 5\' 2"  (1.575 m)   Wt 73.6 kg (162 lb 4.1 oz)   SpO2 97%   BMI 29.68 kg/m  General:  Alert, oriented, calm, in no acute distress  Eyes: pupils round and reactive to light and accomodation, clear sclerea Neck: supple, no masses, trachea mildline  Cardiovascular: RRR, no murmurs or rubs, no peripheral edema  Respiratory: clear to auscultation bilaterally, no wheezes, no crackles  Abdomen: soft, nontender, nondistended, normal bowel tones heard  Skin: dry, no rashes  Musculoskeletal: no joint effusions, normal range of motion  Psychiatric: appropriate affect, normal speech  Neurologic: extraocular muscles intact, clear speech, moving all extremities with intact sensorium    Discharge Instructions You were cared for by a hospitalist during your hospital stay. If you have any questions about your discharge medications or the care you received while you were in the hospital after you are discharged, you can call the unit and asked to speak with the hospitalist on call if the hospitalist that took care of you is not available. Once you are discharged, your primary care physician will handle any further medical issues. Please note that NO REFILLS for any discharge medications will be authorized once you are discharged, as it is imperative that you return to your primary care physician (or establish a relationship with a primary care physician if you do not have one) for your aftercare needs so that they can reassess your need for medications and monitor your lab values.  Discharge Instructions    Diet - low sodium heart healthy    Complete by:  As directed    Increase activity slowly    Complete by:  As  directed        Medication List    STOP taking these medications   polycarbophil 625 MG tablet Commonly known as:  FIBERCON     TAKE these medications   acetaminophen 500 MG tablet Commonly known as:  TYLENOL Take 1,000 mg by mouth every 6 (six) hours as needed.   cetirizine 10 MG tablet Commonly known as:  ZYRTEC Take 10 mg by mouth daily.   ciprofloxacin 500 MG tablet Commonly known as:  CIPRO Take 1 tablet (500 mg total) by mouth 2 (two) times daily.   clonazePAM 1 MG tablet Commonly known as:  KLONOPIN Take 0.5 mg by mouth at bedtime.   cyclobenzaprine 5 MG tablet Commonly known as:  FLEXERIL Take 1 tablet (5 mg total) by mouth 3 (three) times daily as needed for muscle spasms.   fluorometholone 0.1 % ophthalmic suspension Commonly known as:  FML Place 1 drop into both eyes every 14 (fourteen) days.   imipramine 25 MG tablet Commonly known as:  TOFRANIL Take 25 mg by mouth at bedtime.   levothyroxine 75 MCG tablet Commonly known as:  SYNTHROID, LEVOTHROID Take 75 mcg by mouth daily before breakfast.   metoprolol succinate 50 MG 24 hr tablet Commonly known as:  TOPROL-XL Take 50 mg by mouth daily.  metroNIDAZOLE 500 MG tablet Commonly known as:  FLAGYL Take 1 tablet (500 mg total) by mouth 3 (three) times daily.   oxyCODONE 5 MG immediate release tablet Commonly known as:  ROXICODONE Take 1 tablet (5 mg total) by mouth every 4 (four) hours as needed for severe pain.   pantoprazole 40 MG tablet Commonly known as:  PROTONIX Take 40 mg by mouth daily.   simvastatin 40 MG tablet Commonly known as:  ZOCOR Take 20 mg by mouth at bedtime.      Allergies  Allergen Reactions  . Pregabalin Other (See Comments)    "Hallucinations"  . Other Rash    bandaids    Follow-up Information    Manus Gunning, MD. Call in 2 week(s).   Specialty:  Gastroenterology Why:  Will need colonoscopy. Contact information: [address] [place]  [place] [address] [phone]        Chauncey Cruel, MD Follow up in 2 week(s).   Specialty:  Oncology Why:  Bone marrow bx, have appt for Oct 3. Contact information: [place] [address] [phone]            The results of significant diagnostics from this hospitalization (including imaging, microbiology, ancillary and laboratory) are listed below for reference.    Significant Diagnostic Studies: Dg Ribs Unilateral W/chest Right  Result Date: 05/17/2016 CLINICAL DATA:  Pt was sitting on toilet this a.m and got lightheaded and fell against tub hitting rt lateral ribs, pain rt lateral ribs with some sob, no other chest complaints EXAM: RIGHT RIBS AND CHEST - 3+ VIEW COMPARISON:  07/14/2011 FINDINGS: Heart size is normal. The lungs are clear. There are no focal consolidations or pleural effusions. No pulmonary edema. Surgical clips are identified in the axillary regions and right upper quadrant. Oblique views of the ribs show an acute fracture of the right lateral eighth and ninth ribs. No pneumothorax. IMPRESSION: 1. Fracture of the right eighth and ninth ribs. 2. No pneumothorax or acute pulmonary abnormality. Electronically Signed   By: Nolon Nations M.D.   On: 05/17/2016 09:34   Ct Abdomen Pelvis W Contrast  Result Date: 05/17/2016 CLINICAL DATA:  Fall. EXAM: CT ABDOMEN AND PELVIS WITH CONTRAST TECHNIQUE: Multidetector CT imaging of the abdomen and pelvis was performed using the standard protocol following bolus administration of intravenous contrast. CONTRAST:  122mL ISOVUE-300 IOPAMIDOL (ISOVUE-300) INJECTION 61%, 33mL ISOVUE-300 IOPAMIDOL (ISOVUE-300) INJECTION 61% COMPARISON:  04/15/2013 FINDINGS: Lower chest: There is no pleural or pericardial effusion. Hepatobiliary: Exophytic cyst arising from segment 5 of the liver measures 1.3 cm in is unchanged from previous exam. Previous cholecystectomy. No common bile duct dilatation. Pancreas: Unremarkable. No  pancreatic ductal dilatation or surrounding inflammatory changes. Spleen: Normal in size without focal abnormality. Adrenals/Urinary Tract: Adrenal glands are unremarkable. Kidneys are normal, without renal calculi, focal lesion, or hydronephrosis. Bladder is unremarkable. Stomach/Bowel: The stomach is normal. The small bowel loops have a normal course and caliber. There is no bowel obstruction. Normal appearance of the terminal ileum. There is a 8.2 cm segment of abnormal proximal transverse colon. There is decrease in caliber of the bowel within the segment and there is abnormal wall thickening/ edema which measures up to 8 mm. Separately, there is a segment of sigmoid colon which is abnormally thickened with surrounding inflammation. Wall thickness measures up to 1.3 cm, image 67 of series 2. There is diffuse surrounding inflammatory changes including fat stranding in hyperemia of the Vasa recta. No pathologic  dilatation of the colon. There is no free air or fluid collections identified. Distal colonic diverticula noted. Vascular/Lymphatic: Calcified atherosclerotic disease involves the abdominal aorta. No aneurysm. No enlarged retroperitoneal or mesenteric adenopathy. No enlarged pelvic or inguinal lymph nodes. Reproductive: Uterus and bilateral adnexa are unremarkable. Other: There is no ascites or focal fluid collections within the abdomen or pelvis. Musculoskeletal: Degenerative disc disease identified within the lumbar spine. IMPRESSION: 1. There are 2 segments of abnormal: . The first segment involves the proximal transverse colon and measures approximately 8 cm. Here the bowel is decreased in caliber and there is abnormal wall thickening. The second segment involves the sigmoid colon were there is marked diffuse bowel wall thickening and inflammation. Findings are favored to reflect multifocal segmental colitis secondary to inflammatory bowel disease such as Crohn's disease. Less likely, inflammatory  changes of the sigmoid colon may reflect diverticulitis with low grade inflammatory changes involving the proximal transverse colon secondary to inflammatory bowel disease. 2. No evidence for bowel obstruction, perforation or fistula formation. Electronically Signed   By: Kerby Moors M.D.   On: 05/17/2016 10:38    Microbiology: Recent Results (from the past 240 hour(s))  Urine culture     Status: Abnormal   Collection Time: 05/17/16  8:45 AM  Result Value Ref Range Status   Specimen Description URINE, CLEAN CATCH  Final   Special Requests NONE  Final   Culture MULTIPLE SPECIES PRESENT, SUGGEST RECOLLECTION (A)  Final   Report Status 05/18/2016 FINAL  Final  Culture, blood (routine x 2)     Status: None (Preliminary result)   Collection Time: 05/18/16  9:45 PM  Result Value Ref Range Status   Specimen Description BLOOD LEFT HAND  Final   Special Requests IN PEDIATRIC BOTTLE 4ML  Final   Culture   Final    NO GROWTH < 24 HOURS Performed at [facility]    Report Status PENDING  Incomplete  Culture, blood (routine x 2)     Status: None (Preliminary result)   Collection Time: 05/18/16 10:03 PM  Result Value Ref Range Status   Specimen Description BLOOD LEFT HAND  Final   Special Requests BOTTLES DRAWN AEROBIC ONLY 6ML  Final   Culture   Final    NO GROWTH < 24 HOURS Performed at [facility]    Report Status PENDING  Incomplete     Labs: Basic Metabolic Panel:  Recent Labs Lab 05/17/16 0905 05/17/16 0924 05/19/16 0528 05/20/16 0556  NA 137 139 139 138  K 3.4* 3.4* 3.7 3.3*  CL 105 102 110 109  CO2 23  --  23 21*  GLUCOSE 142* 138* 96 88  BUN 8 6 <5* <5*  CREATININE 0.99 0.90 0.71 0.60  CALCIUM 8.5*  --  7.7* 7.7*   Liver Function Tests:  Recent Labs Lab 05/17/16 0905  AST 18  ALT 13*  ALKPHOS 102  BILITOT 1.5*  PROT 6.8  ALBUMIN 3.9    Recent Labs Lab 05/17/16 0905  LIPASE 16   No results for input(s): AMMONIA in the last 168  hours. CBC:  Recent Labs Lab 05/17/16 0905 05/17/16 0924 05/18/16 0600 05/19/16 0528 05/20/16 0556  WBC 1.6*  --  1.8* 1.7* 1.3*  NEUTROABS 0.5*  --   --   --   --   HGB 12.4 12.2 11.3* 11.2* 11.0*  HCT 36.1 36.0 34.6* 33.8* 32.8*  MCV 91.2  --  94.0 94.7 90.6  PLT 124*  --  112* 109* 114*   Cardiac Enzymes:  Recent Labs Lab 05/17/16 0907  CKTOTAL 19*   BNP: BNP (last 3 results) No results for input(s): BNP in the last 8760 hours.  ProBNP (last 3 results) No results for input(s): PROBNP in the last 8760 hours.  CBG:  Recent Labs Lab 05/17/16 0854 05/18/16 0738 05/19/16 0732  GLUCAP 148* 106* 95    Time spent: 35 minutes were spent in preparing this discharge including medication reconciliation, counseling, and coordination of care.  Signed:  [place] Stegemann [employer]  [facility] 05/20/2016, 2:11 PM

## 2016-05-20 NOTE — Telephone Encounter (Signed)
This RN contacted IR per need to rescheduled BMBX per CT - Tiffany will resubmit request to be scheduled next week.

## 2016-05-20 NOTE — Progress Notes (Signed)
Pt discharged from the unit via wheelchair. Discharge instructions were given to the pt and family member. Family aware of appt. No questions or concerns at this time.  R[address]kin [address] W Verdun Rackley, RN

## 2016-05-23 LAB — CULTURE, BLOOD (ROUTINE X 2)
Culture: NO GROWTH
Culture: NO GROWTH

## 2016-05-24 ENCOUNTER — Telehealth: Payer: Self-pay

## 2016-05-24 ENCOUNTER — Telehealth: Payer: Self-pay | Admitting: *Deleted

## 2016-05-24 NOTE — Telephone Encounter (Signed)
-----   Message from Barron Alvine, RN sent at 05/20/2016  2:21 PM EDT -----   ----- Message ----- From: Manus Gunning, MD Sent: 05/19/2016  12:18 PM To: Barron Alvine, RN  Tomasita Beevers, This patient will be discharged in the next 1-2 days. She will need a colonoscopy scheduled with me within 2 weeks if possible. Can you call her once discharged for scheduling? No clinic visit needed. Thanks

## 2016-05-24 NOTE — Telephone Encounter (Signed)
This RN spoke with pt's husband per his call with concerns of pt's current status, prescriptions and statement that her BMBX is scheduled for 06/13/2016.  Mr Gameros states pt is " not improving ".  Diarrhea still occurring. Pt is not eating well. Having some swelling in her left

## 2016-05-24 NOTE — Telephone Encounter (Signed)
Pt states she is having bone marrow testing on Monday and she wants to wait to set up any further procedures.  She will call after the bone marrow test.

## 2016-05-24 NOTE — Telephone Encounter (Signed)
06/06/16 1130 am colon appt pre visit on 05/27/16 at 1 pm Left message on machine to call back on cell number, home number not connecting

## 2016-05-24 NOTE — Telephone Encounter (Signed)
Ok thanks for the follow up. I will await to hear back from her to schedule

## 2016-05-25 ENCOUNTER — Other Ambulatory Visit: Payer: Self-pay | Admitting: *Deleted

## 2016-05-25 ENCOUNTER — Telehealth: Payer: Self-pay | Admitting: *Deleted

## 2016-05-25 ENCOUNTER — Other Ambulatory Visit: Payer: Self-pay | Admitting: Radiology

## 2016-05-25 MED ORDER — CHOLESTYRAMINE 4 GM/DOSE PO POWD: 4.0000 g | Freq: Two times a day (BID) | ORAL | 1 refills | Status: AC

## 2016-05-25 NOTE — Telephone Encounter (Signed)
This RN spoke with pt's husband this AM per his call stating prescription for questran not available at pharmacy.  Per discussion- prescription not noted as received per EPIC- this RN re-entered order and verified per documentation as received.  Per update on pt's condition - overall status is not improving. She is continuing with approximately 5 diarrhea stools a day.  This RN also reiterated if concerns worsen to proceed to the ER.  Mr Drilling verbalized understanding as well as goal at present is to manage symptoms until pt can obtain BMBX for definitive diagnosis.  No other needs at this time.

## 2016-05-27 ENCOUNTER — Encounter: Payer: Self-pay | Admitting: Nurse Practitioner

## 2016-05-27 ENCOUNTER — Emergency Department (HOSPITAL_COMMUNITY)
Admission: EM | Admit: 2016-05-27 | Discharge: 2016-05-28 | Disposition: A | Discharge status: Other Acute Inpatient Hospital | Payer: Medicare Other | Attending: Emergency Medicine | Admitting: Emergency Medicine

## 2016-05-27 ENCOUNTER — Telehealth: Payer: Self-pay | Admitting: Oncology

## 2016-05-27 ENCOUNTER — Encounter (HOSPITAL_COMMUNITY): Payer: Self-pay | Admitting: *Deleted

## 2016-05-27 ENCOUNTER — Ambulatory Visit (HOSPITAL_BASED_OUTPATIENT_CLINIC_OR_DEPARTMENT_OTHER): Payer: Medicare Other

## 2016-05-27 ENCOUNTER — Other Ambulatory Visit: Payer: Self-pay

## 2016-05-27 ENCOUNTER — Other Ambulatory Visit: Payer: Self-pay | Admitting: Oncology

## 2016-05-27 ENCOUNTER — Telehealth: Payer: Self-pay | Admitting: *Deleted

## 2016-05-27 ENCOUNTER — Ambulatory Visit (HOSPITAL_BASED_OUTPATIENT_CLINIC_OR_DEPARTMENT_OTHER): Payer: Medicare Other | Admitting: Nurse Practitioner

## 2016-05-27 ENCOUNTER — Other Ambulatory Visit: Payer: Self-pay | Admitting: *Deleted

## 2016-05-27 VITALS — BP 114/58 | HR 124 | Temp 98.2°F | Resp 20 | Ht 62.0 in | Wt 158.0 lb

## 2016-05-27 DIAGNOSIS — D708 Other neutropenia: Secondary | ICD-10-CM

## 2016-05-27 DIAGNOSIS — E860 Dehydration: Secondary | ICD-10-CM

## 2016-05-27 DIAGNOSIS — D72819 Decreased white blood cell count, unspecified: Secondary | ICD-10-CM

## 2016-05-27 DIAGNOSIS — L03114 Cellulitis of left upper limb: Secondary | ICD-10-CM

## 2016-05-27 DIAGNOSIS — S2239XA Fracture of one rib, unspecified side, initial encounter for closed fracture: Secondary | ICD-10-CM | POA: Insufficient documentation

## 2016-05-27 DIAGNOSIS — R509 Fever, unspecified: Secondary | ICD-10-CM | POA: Diagnosis not present

## 2016-05-27 DIAGNOSIS — S2231XD Fracture of one rib, right side, subsequent encounter for fracture with routine healing: Secondary | ICD-10-CM

## 2016-05-27 DIAGNOSIS — D759 Disease of blood and blood-forming organs, unspecified: Secondary | ICD-10-CM

## 2016-05-27 DIAGNOSIS — C50911 Malignant neoplasm of unspecified site of right female breast: Secondary | ICD-10-CM | POA: Diagnosis not present

## 2016-05-27 DIAGNOSIS — R609 Edema, unspecified: Secondary | ICD-10-CM

## 2016-05-27 DIAGNOSIS — R197 Diarrhea, unspecified: Secondary | ICD-10-CM

## 2016-05-27 DIAGNOSIS — F17210 Nicotine dependence, cigarettes, uncomplicated: Secondary | ICD-10-CM | POA: Diagnosis not present

## 2016-05-27 DIAGNOSIS — C50811 Malignant neoplasm of overlapping sites of right female breast: Secondary | ICD-10-CM

## 2016-05-27 DIAGNOSIS — C9500 Acute leukemia of unspecified cell type not having achieved remission: Secondary | ICD-10-CM | POA: Diagnosis not present

## 2016-05-27 DIAGNOSIS — Z853 Personal history of malignant neoplasm of breast: Secondary | ICD-10-CM | POA: Diagnosis not present

## 2016-05-27 DIAGNOSIS — I10 Essential (primary) hypertension: Secondary | ICD-10-CM | POA: Insufficient documentation

## 2016-05-27 DIAGNOSIS — S2249XA Multiple fractures of ribs, unspecified side, initial encounter for closed fracture: Secondary | ICD-10-CM | POA: Insufficient documentation

## 2016-05-27 DIAGNOSIS — C50912 Malignant neoplasm of unspecified site of left female breast: Secondary | ICD-10-CM

## 2016-05-27 DIAGNOSIS — R600 Localized edema: Secondary | ICD-10-CM | POA: Insufficient documentation

## 2016-05-27 LAB — COMPREHENSIVE METABOLIC PANEL
ANION GAP: 14 meq/L — AB (ref 3–11)
AST: 14 U/L (ref 5–34)
Albumin: 2.7 g/dL — ABNORMAL LOW (ref 3.5–5.0)
Alkaline Phosphatase: 137 U/L (ref 40–150)
BILIRUBIN TOTAL: 0.57 mg/dL (ref 0.20–1.20)
BUN: 11.4 mg/dL (ref 7.0–26.0)
CHLORIDE: 95 meq/L — AB (ref 98–109)
CO2: 24 meq/L (ref 22–29)
CREATININE: 0.8 mg/dL (ref 0.6–1.1)
Calcium: 8.8 mg/dL (ref 8.4–10.4)
EGFR: 72 mL/min/{1.73_m2} — ABNORMAL LOW (ref >90)
Glucose: 105 mg/dl (ref 70–140)
Potassium: 3.4 mEq/L — ABNORMAL LOW (ref 3.5–5.1)
Sodium: 133 mEq/L — ABNORMAL LOW (ref 136–145)
TOTAL PROTEIN: 6.7 g/dL (ref 6.4–8.3)

## 2016-05-27 LAB — CBC WITH DIFFERENTIAL/PLATELET
HCT: 38.7 % (ref 34.8–46.6)
HEMOGLOBIN: 12.6 g/dL (ref 11.6–15.9)
MCH: 30.8 pg (ref 25.1–34.0)
MCHC: 32.6 g/dL (ref 31.5–36.0)
MCV: 94.6 fL (ref 79.5–101.0)
PLATELETS: ADEQUATE 10*3/uL (ref 145–400)
RBC: 4.09 10*6/uL (ref 3.70–5.45)
RDW: 14.8 % — AB (ref 11.2–14.5)
WBC: 2.9 10*3/uL — AB (ref 3.9–10.3)

## 2016-05-27 LAB — MANUAL DIFFERENTIAL
ALC: 0.6 10*3/uL — AB (ref 0.9–3.3)
ANC (CHCC MAN DIFF): 1.1 10*3/uL — AB (ref 1.5–6.5)
BLASTS: 17 % — AB (ref 0–0)
Band Neutrophils: 3 % (ref 0–10)
Basophil: 1 % (ref 0–2)
EOS%: 3 % (ref 0–7)
LYMPH: 22 % (ref 14–49)
MONO: 13 % (ref 0–14)
Metamyelocytes: 3 % — ABNORMAL HIGH (ref 0–0)
Myelocytes: 0 % (ref 0–0)
NRBC: 0 % (ref 0–0)
Other Cell: 7 % — ABNORMAL HIGH (ref 0–0)
PLT EST: ADEQUATE
PROMYELO: 0 % (ref 0–0)
SEG: 31 % — AB (ref 38–77)
VARIANT LYMPH: 0 % (ref 0–0)

## 2016-05-27 LAB — I-STAT CG4 LACTIC ACID, ED
Lactic Acid, Venous: 2.32 mmol/L (ref 0.5–1.9)
Lactic Acid, Venous: 1.85 mmol/L (ref 0.5–1.9)

## 2016-05-27 LAB — URIC ACID: Uric Acid, Serum: 5.1 mg/dL (ref 2.3–6.6)

## 2016-05-27 LAB — MAGNESIUM: Magnesium: 2 mg/dl (ref 1.5–2.5)

## 2016-05-27 LAB — LACTATE DEHYDROGENASE: LDH: 161 U/L (ref 98–192)

## 2016-05-27 MED ORDER — MORPHINE SULFATE (PF) 4 MG/ML IV SOLN
4.0000 mg | INTRAVENOUS | Status: DC | PRN
  Administered 2016-05-27 (×2): 4 mg via INTRAVENOUS
  Filled 2016-05-27 (×2): qty 1

## 2016-05-27 MED ORDER — SODIUM CHLORIDE 0.9 % IV BOLUS (SEPSIS)
250.0000 mL | Freq: Once | INTRAVENOUS | Status: AC
  Administered 2016-05-27: 250 mL via INTRAVENOUS

## 2016-05-27 MED ORDER — PIPERACILLIN-TAZOBACTAM 3.375 G IVPB 30 MIN
3.3750 g | Freq: Once | INTRAVENOUS | Status: AC
Start: 2016-05-27 — End: 2016-05-27
  Administered 2016-05-27: 3.375 g via INTRAVENOUS
  Filled 2016-05-27: qty 50

## 2016-05-27 MED ORDER — VANCOMYCIN HCL IN DEXTROSE 750-5 MG/150ML-% IV SOLN
750.0000 mg | Freq: Two times a day (BID) | INTRAVENOUS | Status: DC
  Filled 2016-05-27: qty 150

## 2016-05-27 MED ORDER — MORPHINE SULFATE 4 MG/ML IJ SOLN
2.0000 mg | Freq: Once | INTRAMUSCULAR | Status: AC
  Administered 2016-05-27: 2 mg via INTRAVENOUS
  Filled 2016-05-27: qty 1

## 2016-05-27 MED ORDER — MORPHINE SULFATE (PF) 4 MG/ML IV SOLN
INTRAVENOUS | Status: AC
  Filled 2016-05-27: qty 1

## 2016-05-27 MED ORDER — VANCOMYCIN HCL IN DEXTROSE 1-5 GM/200ML-% IV SOLN
1000.0000 mg | Freq: Once | INTRAVENOUS | Status: AC
  Administered 2016-05-27: 1000 mg via INTRAVENOUS
  Filled 2016-05-27: qty 200

## 2016-05-27 MED ORDER — SODIUM CHLORIDE 0.9 % IV SOLN
Freq: Once | INTRAVENOUS | Status: AC
  Administered 2016-05-27: 13:00:00 via INTRAVENOUS

## 2016-05-27 MED ORDER — SODIUM CHLORIDE 0.9 % IV BOLUS (SEPSIS)
1000.0000 mL | Freq: Once | INTRAVENOUS | Status: AC
  Administered 2016-05-27: 1000 mL via INTRAVENOUS

## 2016-05-27 MED ORDER — PIPERACILLIN-TAZOBACTAM 3.375 G IVPB
3.3750 g | Freq: Three times a day (TID) | INTRAVENOUS | Status: DC
  Administered 2016-05-27: 3.375 g via INTRAVENOUS
  Filled 2016-05-27: qty 50

## 2016-05-27 MED ORDER — MORPHINE SULFATE (PF) 2 MG/ML IV SOLN
2.0000 mg | Freq: Once | INTRAVENOUS | Status: AC
  Administered 2016-05-27: 2 mg via INTRAVENOUS
  Filled 2016-05-27: qty 1

## 2016-05-27 NOTE — Assessment & Plan Note (Signed)
Patient felt ill a few weeks ago; and experienced a full syncopal event at her home.  When she fell to the ground.  She fractured her right ribs.  She was evaluated in the emergency department at that time; and found to have a very low white blood cell count.  She was scheduled for a bone marrow biopsy for further evaluation last week; but canceled due to the continued right rib discomfort.  She is scheduled for a bone marrow biopsy this coming Monday, 05/30/2016.  Of note: Patient was advised that she should present to the [facility] emergency department last week for further evaluation; and was advised that this would be the quickest way to obtain her bone marrow biopsy.  Patient's husband states that he went to [facility] emergency department last week; and weighted in the waiting room for over 2 hours with no assistance.  He stated that they had to leave the emergency department since his wife was in so much pain.  Patient presented to the [facility] today with multiple complaints.  She states that the left arm cellulitis has progressed.  She states that she completed both see Cipro and Flagyl today.  She states that she's had a fever up to 101.8 in this past few days.  She states that she continues with increased edema to the left arm; and is now noted.  Newly noticed left leg edema as well.  She continues with chronic diarrhea, multiple locations per day.  She has had minimal oral intake and feels dehydrated today.  Exam today reveals patient appearing ill and uncomfortable.  Her primary complaint was the right rib pain at the site of the fractures.  Patient appears flushed; but when questioned-patient states that she has rosacea.  Patient has significant left arm edema; and apparent cellulitis to the left upper extremity above the elbow.  The erythema, warmth, and tenderness extends from just above the left elbow beyond the axilla site and into the left chest  region.  There are no red streaks noted.  Also, the left lower extremity is edematous as well.  There was no calf tenderness on exam.  Vital signs on initial check revealed blood pressure 146/87, heart rate 121, O2 sat 92% on room air, and temperature 98.0.  Noted that patient's heart rate frequently changed from a low of 92 a maximum of 125.  When questioned-patient states she has a history of intermittent tachycardia; and has been seen by a cardiologist in the past.  Labs obtained while in the cancer center included a CBC with differential, a complete metabolic panel, and magnesium, and blood cultures 2.  Did not obtain a urine or urine culture.  Note: No labs had been resulted prior to the patient being transported to the emergency department.  Reviewed all findings with Dr. Jana Hakim; and he suggested that the best option for the patient would be for the patient to be seen and treated at [facility]; but both the patient and her husband refused to consider this option.  Patient will be transported to the emergency department for further evaluation and management.  History and report were called to the emergency department charge nurse; prior to the patient being transported to the emergency department via wheelchair.  Per the cancer Center nurse.  CODE STATUS: Patient should be considered a full code; since there are no advanced directives in patient's chart.

## 2016-05-27 NOTE — ED Notes (Signed)
[facility] called for transfer 

## 2016-05-27 NOTE — Progress Notes (Signed)
SYMPTOM MANAGEMENT CLINIC    Chief Complaint: Diarrhea, dehydration, cellulitis, fever  HPI:  Tammy Ewing 70 y.o. female diagnosed with leukopenia.  Currently awaiting a bone marrow biopsy prior to making a full diagnosis.    No history exists.    Review of Systems  Constitutional: Positive for chills, fever and malaise/fatigue.  Gastrointestinal: Positive for diarrhea and nausea. Negative for vomiting.  Skin:       Cellulitis of the left upper arm.  Neurological: Positive for weakness.  All other systems reviewed and are negative.   Past Medical History:  Diagnosis Date  . Breast CA ([place])   . Breast cancer ([place]) 07/21/2011  . Breast cancer, left breast ([place]) 07/21/2011  . High cholesterol   . Hypertension   . Irritable bowel syndrome (IBS)     Past Surgical History:  Procedure Laterality Date  . MASTECTOMY     bilateral    has Breast cancer, right breast ([place]); Breast cancer, left breast ([place]); Cytopenia; Syncope; Colitis; Leukopenia; Rib fractures; Diarrhea; Dehydration; Peripheral edema; and Cellulitis of left upper extremity on her problem list.    is allergic to pregabalin; other; and tape.    Medication List       Accurate as of 05/27/16  4:27 PM. Always use your most recent med list.          acetaminophen 500 MG tablet Commonly known as:  TYLENOL Take 1,000 mg by mouth every 6 (six) hours as needed for moderate pain.   cetirizine 10 MG tablet Commonly known as:  ZYRTEC Take 10 mg by mouth daily as needed for allergies.   cholestyramine 4 GM/DOSE powder Commonly known as:  QUESTRAN Take 1 packet (4 g total) by mouth 2 (two) times daily.   ciprofloxacin 500 MG tablet Commonly known as:  CIPRO Take 1 tablet (500 mg total) by mouth 2 (two) times daily.   clonazePAM 1 MG tablet Commonly known as:  KLONOPIN Take 0.5 mg by mouth at bedtime as needed (sleep).   cyclobenzaprine 5 MG tablet Commonly known as:  FLEXERIL Take 1 tablet (5 mg  total) by mouth 3 (three) times daily as needed for muscle spasms.   fluorometholone 0.1 % ophthalmic suspension Commonly known as:  FML Place 1 drop into both eyes every 14 (fourteen) days.   imipramine 25 MG tablet Commonly known as:  TOFRANIL Take 25 mg by mouth at bedtime.   levothyroxine 75 MCG tablet Commonly known as:  SYNTHROID, LEVOTHROID Take 75 mcg by mouth daily before breakfast.   metoprolol succinate 50 MG 24 hr tablet Commonly known as:  TOPROL-XL Take 50 mg by mouth daily.   metroNIDAZOLE 500 MG tablet Commonly known as:  FLAGYL Take 1 tablet (500 mg total) by mouth 3 (three) times daily.   oxyCODONE 5 MG immediate release tablet Commonly known as:  ROXICODONE Take 1 tablet (5 mg total) by mouth every 4 (four) hours as needed for severe pain.   pantoprazole 40 MG tablet Commonly known as:  PROTONIX Take 40 mg by mouth daily.   simvastatin 40 MG tablet Commonly known as:  ZOCOR Take 20 mg by mouth at bedtime.        PHYSICAL EXAMINATION  Oncology Vitals 05/27/2016 05/27/2016  Height - -  Weight - -  Weight (lbs) - -  BMI (kg/m2) - -  Temp - -  Pulse 121 122  Resp 22 19  Resp (Historical as of 04/05/12) - -  SpO2 95 94  BSA (m2) - -   BP Readings from Last 2 Encounters:  05/27/16 (!) 126/51  05/27/16 (!) 114/58    Physical Exam  Constitutional: She appears dehydrated. She appears unhealthy. She has a sickly appearance.  HENT:  Head: Normocephalic and atraumatic.  Mouth/Throat: Oropharynx is clear and moist.  Eyes: Conjunctivae and EOM are normal. Pupils are equal, round, and reactive to light. Right eye exhibits no discharge. Left eye exhibits no discharge. No scleral icterus.  Neck: Normal range of motion. Neck supple. No JVD present. No tracheal deviation present. No thyromegaly present.  Cardiovascular: Normal rate, regular rhythm, normal heart sounds and intact distal pulses.   Pulmonary/Chest: Effort normal and breath sounds normal. No  respiratory distress. She has no wheezes. She has no rales. She exhibits no tenderness.  Abdominal: Soft. Bowel sounds are normal. She exhibits no distension and no mass. There is no tenderness. There is no rebound and no guarding.  Musculoskeletal: Normal range of motion. She exhibits edema and tenderness.  Left upper extremity and left lower extremity.  Lymphadenopathy:    She has no cervical adenopathy.  Neurological: She is alert.  Patient appeared slow to answer with some questions only.  Skin: Skin is warm and dry. No rash noted. There is erythema. No pallor.  Cellulitis to the left upper arm from just above the elbow to the axilla with some extension onto the left chest wall.  Psychiatric:  Patient appears anxious.  Nursing note and vitals reviewed.   LABORATORY DATA:. Admission on 05/27/2016  Component Date Value Ref Range Status  . Lactic Acid, Venous 05/27/2016 2.32* 0.5 - 1.9 mmol/L Final  . Comment 05/27/2016 NOTIFIED PHYSICIAN   Final  . Uric Acid, Serum 05/27/2016 5.1  2.3 - 6.6 mg/dL Final  . LDH 05/27/2016 161  98 - 192 U/L Final  Appointment on 05/27/2016  Component Date Value Ref Range Status  . WBC 05/27/2016 2.9* 3.9 - 10.3 10e3/uL Final  . HGB 05/27/2016 12.6  11.6 - 15.9 g/dL Final  . HCT 05/27/2016 38.7  34.8 - 46.6 % Final  . Platelets 05/27/2016 Clumped Platelets--Appears Adequate  145 - 400 10e3/uL Final  . MCV 05/27/2016 94.6  79.5 - 101.0 fL Final  . MCH 05/27/2016 30.8  25.1 - 34.0 pg Final  . MCHC 05/27/2016 32.6  31.5 - 36.0 g/dL Final  . RBC 05/27/2016 4.09  3.70 - 5.45 10e6/uL Final  . RDW 05/27/2016 14.8* 11.2 - 14.5 % Final  . Sodium 05/27/2016 133* 136 - 145 mEq/L Final  . Potassium 05/27/2016 3.4* 3.5 - 5.1 mEq/L Final  . Chloride 05/27/2016 95* 98 - 109 mEq/L Final  . CO2 05/27/2016 24  22 - 29 mEq/L Final  . Glucose 05/27/2016 105  70 - 140 mg/dl Final  . BUN 05/27/2016 11.4  7.0 - 26.0 mg/dL Final  . Creatinine 05/27/2016 0.8  0.6 - 1.1  mg/dL Final  . Total Bilirubin 05/27/2016 0.57  0.20 - 1.20 mg/dL Final  . Alkaline Phosphatase 05/27/2016 137  40 - 150 U/L Final  . AST 05/27/2016 14  5 - 34 U/L Final  . ALT 05/27/2016 <9  0 - 55 U/L Final  . Total Protein 05/27/2016 6.7  6.4 - 8.3 g/dL Final  . Albumin 05/27/2016 2.7* 3.5 - 5.0 g/dL Final  . Calcium 05/27/2016 8.8  8.4 - 10.4 mg/dL Final  . Anion Gap 05/27/2016 14* 3 - 11 mEq/L Final  . EGFR 05/27/2016 72* >90 ml/min/1.73 m2 Final  .  Magnesium 05/27/2016 2.0  1.5 - 2.5 mg/dl Final  . ANC ([facility] manual diff) 05/27/2016 1.1* 1.5 - 6.5 10e3/uL Final  . ALC 05/27/2016 0.6* 0.9 - 3.3 10e3/uL Final  . SEG 05/27/2016 31* 38 - 77 % Final  . Band Neutrophils 05/27/2016 3  0 - 10 % Final  . LYMPH 05/27/2016 22  14 - 49 % Final  . MONO 05/27/2016 13  0 - 14 % Final  . EOS 05/27/2016 3  0 - 7 % Final  . Basophil 05/27/2016 1  0 - 2 % Final  . Metamyelocytes 05/27/2016 3* 0 - 0 % Final  . Myelocytes 05/27/2016 0  0 - 0 % Final  . PROMYELO 05/27/2016 0  0 - 0 % Final  . Blasts 05/27/2016 17* 0 - 0 % Final  . Variant Lymph 05/27/2016 0  0 - 0 % Final  . Other Cell 05/27/2016 7 Atypical mononuclear some plasmacytoid* 0 - 0 % Final  . nRBC 05/27/2016 0  0 - 0 % Final  . Polychromasia 05/27/2016 Slight  Slight Final  . White Cell Comments 05/27/2016 Dysplastic Neuts, occ myelocyte   Final  . PLT EST 05/27/2016 Adequate  Adequate Final       RADIOGRAPHIC STUDIES: No results found.  ASSESSMENT/PLAN:    Rib fractures Patient states that approximately 2 weeks ago.  She felt ill; and had a full syncopal event at home.  When she fell.  She experienced right rib fractures.  She was seen in the emergency department at that time and diagnosed with rib fractures.  She states that she was sent home with oxycodone; but that she barely takes it.  She typically takes Tylenol for pain.  Exam today reveals tenderness to the right lateral ribs.  There is no obvious bruising, to the  site.  Patient was given morphine IV prior to being transported to the emergency department today.  Peripheral edema Patient's left lower extremity is much more edematous than the right.  There is no calf tenderness with palpation.  Patient's husband states that her left leg has become much more edematous within the past few days.  She denies any known injury or trauma to the leg.  Patient will be transported to the emergency department for further evaluation and management today.  Leukopenia Patient felt ill a few weeks ago; and experienced a full syncopal event at her home.  When she fell to the ground.  She fractured her right ribs.  She was evaluated in the emergency department at that time; and found to have a very low white blood cell count.  She was scheduled for a bone marrow biopsy for further evaluation last week; but canceled due to the continued right rib discomfort.  She is scheduled for a bone marrow biopsy this coming Monday, 05/30/2016.  Of note: Patient was advised that she should present to the [facility] emergency department last week for further evaluation; and was advised that this would be the quickest way to obtain her bone marrow biopsy.  Patient's husband states that he went to [facility] emergency department last week; and weighted in the waiting room for over 2 hours with no assistance.  He stated that they had to leave the emergency department since his wife was in so much pain.  Patient presented to the [facility] today with multiple complaints.  She states that the left arm cellulitis has progressed.  She states that she completed both see  Cipro and Flagyl today.  She states that she's had a fever up to 101.8 in this past few days.  She states that she continues with increased edema to the left arm; and is now noted.  Newly noticed left leg edema as well.  She continues with chronic diarrhea, multiple locations per day.  She has had  minimal oral intake and feels dehydrated today.  Exam today reveals patient appearing ill and uncomfortable.  Her primary complaint was the right rib pain at the site of the fractures.  Patient appears flushed; but when questioned-patient states that she has rosacea.  Patient has significant left arm edema; and apparent cellulitis to the left upper extremity above the elbow.  The erythema, warmth, and tenderness extends from just above the left elbow beyond the axilla site and into the left chest region.  There are no red streaks noted.  Also, the left lower extremity is edematous as well.  There was no calf tenderness on exam.  Vital signs on initial check revealed blood pressure 146/87, heart rate 121, O2 sat 92% on room air, and temperature 98.0.  Noted that patient's heart rate frequently changed from a low of 92 a maximum of 125.  When questioned-patient states she has a history of intermittent tachycardia; and has been seen by a cardiologist in the past.  Labs obtained while in the cancer center included a CBC with differential, a complete metabolic panel, and magnesium, and blood cultures 2.  Did not obtain a urine or urine culture.  Note: No labs had been resulted prior to the patient being transported to the emergency department.  Reviewed all findings with Dr. Jana Hakim; and he suggested that the best option for the patient would be for the patient to be seen and treated at [facility]; but both the patient and her husband refused to consider this option.  Patient will be transported to the emergency department for further evaluation and management.  History and report were called to the emergency department charge nurse; prior to the patient being transported to the emergency department via wheelchair.  Per the cancer Center nurse.  CODE STATUS: Patient should be considered a full code; since there are no advanced directives in patient's chart.  Diarrhea Patient states that  she's been having frequent diarrhea for the past few weeks.  She states she has tried Imodium and Questran with minimal effectiveness.  She appears dehydrated state; and received IV fluid rehydration while at the cancer center.  Dehydration Patient presented to the [facility] with complaint of diarrhea and decreased oral intake.  She appeared dehydrated; received IV fluid rehydration while cancer Center today.  Cellulitis of left upper extremity Patient has significant left arm edema; and apparent cellulitis to the left upper extremity above the elbow.  The erythema, warmth, and tenderness extends from just above the left elbow beyond the axilla site and into the left chest region.  There are no red streaks noted.    Breast cancer, right breast [facility]) Patient has a history of breast cancer; and underwent a bilateral mastectomy in 2009.  She is currently undergoing observation only.   Patient stated understanding of all instructions; and was in agreement with this plan of care. The patient knows to call the clinic with any problems, questions or concerns.   Total time spent with patient was 40 minutes;  with greater than 75 percent of that time spent in face to face counseling regarding patient's symptoms,  and coordination of  care and follow up.  Disclaimer:This dictation was prepared with Dragon/digital dictation along with [employer]. Any transcriptional errors that result from this process are unintentional.  Drue Second, NP 05/27/2016  Transported to ED, room 4 via w/c, with husband. Report given to [place], RN

## 2016-05-27 NOTE — Telephone Encounter (Signed)
No 9/22 los/order/referrals. Patient has existing appointments on schedule.

## 2016-05-27 NOTE — ED Notes (Signed)
Bed: WA04 Expected date:  Expected time:  Means of arrival:  Comments: [place]

## 2016-05-27 NOTE — Telephone Encounter (Signed)
This RN received call from pt's husband stating concern due to pt continuing with diarrhea and not improving- " I feel like Dr Jana Hakim needs to see her today if possible since the weekend is coming "  Note pt was to have a BMBX last week but was unable due to pain secondary to fall - bx is now scheduled for Monday 9/25.  See note from 9/20 for other details.  Plan per call is for pt to come in to symptom management post labs for assessment and recommendations.

## 2016-05-27 NOTE — Assessment & Plan Note (Signed)
Patient presented to the [facility] with complaint of diarrhea and decreased oral intake.  She appeared dehydrated; received IV fluid rehydration while cancer Center today.

## 2016-05-27 NOTE — ED Provider Notes (Signed)
[facility] DEPT Provider Note   CSN: [id number] Arrival date & time: 05/27/16  1343     History   Chief Complaint Chief Complaint  Patient presents with  . Other    leukopenia    HPI Tammy Ewing is a 70 y.o. female.  HPI 71 year old female with past medical history of breast cancer status post bilateral mastectomy, who presents from oncology clinic for evaluation of likely acute leukemia. Patient has had approximately 3-4 weeks of nightly fevers to 101-10 42F. She has had progressively worsening general malaise, headache and nausea over the same time period. She was recently evaluated in the ED for these symptoms and was admitted earlier this week for acute UTI and possible diverticulitis. She was noted to be leukopenic at that time but not neutropenic and was discharged with outpatient oncology follow-up. In clinic, patient had lab work drawn which was noted for a white blood cell count of 2.9 with clumped platelets and smear showing plasmacytoid, abnormal cells. She was subsequent sent here by Dr. Jana Hakim for stabilization, treatment, and transferred to [place]. On assessment, patient states she feels overall tired but denies any specific complaints. Denies any headache, chest pain, cough, shortness of breath or other complaints.  Past Medical History:  Diagnosis Date  . Breast CA ([place])   . Breast cancer ([place]) 07/21/2011  . Breast cancer, left breast ([place]) 07/21/2011  . High cholesterol   . Hypertension   . Irritable bowel syndrome (IBS)     Patient Active Problem List   Diagnosis Date Noted  . Rib fractures 05/27/2016  . Diarrhea 05/27/2016  . Dehydration 05/27/2016  . Peripheral edema 05/27/2016  . Cellulitis of left upper extremity 05/27/2016  . Syncope 05/17/2016  . Colitis   . Leukopenia   . Cytopenia 05/06/2016  . Breast cancer, right breast ([place]) 07/21/2011  . Breast cancer, left breast ([place]) 07/21/2011    Past Surgical History:  Procedure  Laterality Date  . MASTECTOMY     bilateral    OB History    No data available       Home Medications    Prior to Admission medications   Medication Sig Start Date End Date Taking? Authorizing Provider  acetaminophen (TYLENOL) 500 MG tablet Take 1,000 mg by mouth every 6 (six) hours as needed for moderate pain.    Yes Historical Provider, MD  cetirizine (ZYRTEC) 10 MG tablet Take 10 mg by mouth daily as needed for allergies.    Yes Historical Provider, MD  clonazePAM (KLONOPIN) 1 MG tablet Take 0.5 mg by mouth at bedtime as needed (sleep).    Yes Historical Provider, MD  cyclobenzaprine (FLEXERIL) 5 MG tablet Take 1 tablet (5 mg total) by mouth 3 (three) times daily as needed for muscle spasms. 05/20/16  Yes Mir Marry Guan, MD  fluorometholone (FML) 0.1 % ophthalmic suspension Place 1 drop into both eyes every 14 (fourteen) days. 01/12/16  Yes Historical Provider, MD  oxyCODONE (ROXICODONE) 5 MG immediate release tablet Take 1 tablet (5 mg total) by mouth every 4 (four) hours as needed for severe pain. 05/20/16  Yes Mir Marry Guan, MD  cholestyramine Lucrezia Starch) 4 GM/DOSE powder Take 1 packet (4 g total) by mouth 2 (two) times daily. Patient not taking: Reported on 05/27/2016 05/25/16   Chauncey Cruel, MD  ciprofloxacin (CIPRO) 500 MG tablet Take 1 tablet (500 mg total) by mouth 2 (two) times daily. 05/20/16 05/27/16  Mir Marry Guan, MD  imipramine (TOFRANIL) 25 MG tablet  Take 25 mg by mouth at bedtime.      Historical Provider, MD  levothyroxine (SYNTHROID, LEVOTHROID) 75 MCG tablet Take 75 mcg by mouth daily before breakfast.    Historical Provider, MD  metoprolol (TOPROL-XL) 50 MG 24 hr tablet Take 50 mg by mouth daily.      Caryl Bis, MD  metroNIDAZOLE (FLAGYL) 500 MG tablet Take 1 tablet (500 mg total) by mouth 3 (three) times daily. 05/20/16 05/27/16  Mir Marry Guan, MD  pantoprazole (PROTONIX) 40 MG tablet Take 40 mg by mouth daily.    Historical  Provider, MD  simvastatin (ZOCOR) 40 MG tablet Take 20 mg by mouth at bedtime.     Caryl Bis, MD    Family History No family history on file.  Social History Social History  Substance Use Topics  . Smoking status: Current Every Day Smoker    Packs/day: 1.00    Years: 50.00    Types: Cigarettes  . Smokeless tobacco: Never Used  . Alcohol use No     Allergies   Pregabalin; Other; and Tape   Review of Systems Review of Systems  Constitutional: Positive for chills, fatigue, fever and unexpected weight change.  HENT: Negative for congestion, rhinorrhea and sore throat.   Eyes: Negative for visual disturbance.  Respiratory: Positive for cough. Negative for shortness of breath and wheezing.   Cardiovascular: Negative for chest pain and leg swelling.  Gastrointestinal: Negative for abdominal pain, diarrhea, nausea and vomiting.  Genitourinary: Negative for dysuria, flank pain, vaginal bleeding and vaginal discharge.  Musculoskeletal: Negative for neck pain.  Skin: Negative for rash.  Allergic/Immunologic: Negative for immunocompromised state.  Neurological: Negative for syncope and headaches.  Hematological: Does not bruise/bleed easily.  All other systems reviewed and are negative.    Physical Exam Updated Vital Signs BP (!) 126/51 (BP Location: Right Arm)   Pulse (!) 121   Temp 98.1 F (36.7 C) (Oral)   Resp 22   Ht 5\' 2"  (1.575 m)   Wt 158 lb (71.7 kg)   SpO2 95%   BMI 28.90 kg/m   Physical Exam  Constitutional: She is oriented to person, place, and time. She appears well-developed and well-nourished. She appears distressed (Anxious appearing).  HENT:  Head: Normocephalic and atraumatic.  Mouth/Throat: Oropharynx is clear and moist.  Eyes: Conjunctivae are normal.  Neck: Neck supple.  Cardiovascular: Regular rhythm and normal heart sounds.  Tachycardia present.  Exam reveals no friction rub.   No murmur heard. Pulmonary/Chest: Effort normal and breath  sounds normal. No respiratory distress. She has no wheezes. She has no rales.  Abdominal: Soft. Bowel sounds are normal. She exhibits no distension.  Musculoskeletal: She exhibits no edema.  Neurological: She is alert and oriented to person, place, and time. She exhibits normal muscle tone.  Skin: Skin is warm. Capillary refill takes less than 2 seconds.  Psychiatric: She has a normal mood and affect.  Nursing note and vitals reviewed.    ED Treatments / Results  Labs (all labs ordered are listed, but only abnormal results are displayed) Labs Reviewed  I-STAT CG4 LACTIC ACID, ED - Abnormal; Notable for the following:       Result Value   Lactic Acid, Venous 2.32 (*)    All other components within normal limits  URINE CULTURE  URIC ACID  LACTATE DEHYDROGENASE  URINALYSIS, ROUTINE W REFLEX MICROSCOPIC (NOT AT [facility])    EKG  EKG Interpretation  Date/Time:  Friday May 27 2016 14:29:22  EDT Ventricular Rate:  125 PR Interval:    QRS Duration: 69 QT Interval:  328 QTC Calculation: 473 R Axis:   41 Text Interpretation:  Sinus or ectopic atrial tachycardia Atrial premature complexes Low voltage, extremity leads Nonspecific T abnormalities, lateral leads No significant change since last tracing Confirmed by Kamran Coker MD, Lysbeth Galas [phone]) on 05/27/2016 4:28:10 PM       Radiology No results found.  Procedures Procedures (including critical care time)  Medications Ordered in ED Medications  vancomycin (VANCOCIN) IVPB 750 mg/150 ml premix (not administered)  piperacillin-tazobactam (ZOSYN) IVPB 3.375 g (not administered)  morphine 2 MG/ML injection 2 mg (not administered)  sodium chloride 0.9 % bolus 1,000 mL (1,000 mLs Intravenous New Bag/Given 05/27/16 1458)    And  sodium chloride 0.9 % bolus 1,000 mL (0 mLs Intravenous Stopped 05/27/16 1518)    And  sodium chloride 0.9 % bolus 250 mL (0 mLs Intravenous Stopped 05/27/16 1607)  piperacillin-tazobactam (ZOSYN) IVPB 3.375 g  (3.375 g Intravenous New Bag/Given 05/27/16 1516)  vancomycin (VANCOCIN) IVPB 1000 mg/200 mL premix (1,000 mg Intravenous New Bag/Given 05/27/16 1530)     Initial Impression / Assessment and Plan / ED Course  I have reviewed the triage vital signs and the nursing notes.  Pertinent labs & imaging results that were available during my care of the patient were reviewed by me and considered in my medical decision making (see chart for details).  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 CBC at clinic showed white blood cell count of 2.9, clumped platelets, and atypical plasmacytoid cells on smear. Absolute neutrophil count is 1.1 . On arrival, patient is tachycardic but afebrile and otherwise nontoxic. Examination is as above. Will obtain lactate, start sepsis orders with vancomycin and zosyn given mild neutropenia, and admit. Per Dr. Virgie Dad recommendations, discussed with Dr. Florene Glen at [facility] and will transfer for management of acute leukemia and immunosuppressed fever. Patient is in agreement. Family updated. IVF running, BP remains stable, and ABX given. BCx sent prior to arrival in ED (in clinic), which RN confirmed with clinic.   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

## 2016-05-27 NOTE — ED Notes (Addendum)
Pt being sent by [facility].  C/o diarrhea, fever, and increasing cellulitis in LUE spreading into chest.  Hx of breast CA w/ double mastectomy (2009).  Pt recently finished outpatient treatment for cellulitis (2 antibiotics).  Pt had recent fall w/ rib fractures.  [facility] provider reports neutropenia d/t unknown diagnosis and scheduled for a bone biopsy on Monday.  Sts they collect all labwork except a UA.

## 2016-05-27 NOTE — Assessment & Plan Note (Signed)
Patient states that she's been having frequent diarrhea for the past few weeks.  She states she has tried Imodium and Questran with minimal effectiveness.  She appears dehydrated state; and received IV fluid rehydration while at the cancer center.

## 2016-05-27 NOTE — ED Notes (Signed)
Attempted to call Mrs. Geller's spouse to let him know of pt's status as ready for transfer to [facility]. Mr. Cherubini did not answer but a message was left for him.

## 2016-05-27 NOTE — ED Triage Notes (Signed)
Patient brought over from cancer center with diarrhea.  She was treated for breast cancer with bilat mastectomies a number of years ago.  She was recently found to be neutropenic.  She was scheduled to have a bone marrow biopsy last week, but fell and was hospitalized.  Patient presents with diarrhea for several days.  She sees Dr. Havery Moros with GI and Dr. Jana Hakim for hem/onc.

## 2016-05-27 NOTE — ED Notes (Signed)
Called report to Emily, RN.

## 2016-05-27 NOTE — Assessment & Plan Note (Signed)
Patient has a history of breast cancer; and underwent a bilateral mastectomy in 2009.  She is currently undergoing observation only.

## 2016-05-27 NOTE — Assessment & Plan Note (Signed)
Patient's left lower extremity is much more edematous than the right.  There is no calf tenderness with palpation.  Patient's husband states that her left leg has become much more edematous within the past few days.  She denies any known injury or trauma to the leg.  Patient will be transported to the emergency department for further evaluation and management today.

## 2016-05-27 NOTE — Progress Notes (Signed)
Pharmacy Antibiotic Note  Tammy Ewing is a 70 y.o. female admitted on 05/27/2016 with sepsis.  [facility] being [facility].  C/o diarrhea, fever, and increasing cellulitis in LUE spreading into chest.  Hx of breast CA w/ double mastectomy (2009).  Pt recently finished outpatient treatment for cellulitis (cipro and flagyl).  Pt had recent fall w/ rib fractures. Pharmacy has been consulted for vancomycin and zosyn dosing.  Plan: Zosyn 3.375g IV Q8H infused over 4hrs. Vancomycin 1gm IV x1 in ED then 750mg  IV q12h Follow renal function, cultures, clinical course    Height: 5\' 2"  (157.5 cm) Weight: 158 lb (71.7 kg) IBW/kg (Calculated) : 50.1  Temp (24hrs), Avg:98.1 F (36.7 C), Min:98 F (36.7 C), Max:98.2 F (36.8 C)   Recent Labs Lab 05/27/16 1219  WBC 2.9*  CREATININE 0.8    Estimated Creatinine Clearance: 59.8 mL/min (by C-G formula based on SCr of 0.8 mg/dL).    Allergies  Allergen Reactions  . Pregabalin Other (See Comments)    "Hallucinations"  . Other Rash    bandaids     Antimicrobials this admission: 9/22 vancomycin >> 9/22 zosyn >> Dose adjustments this admission:   Microbiology results: 9/22 BCx: 9/22 UCx:   Thank you for allowing pharmacy to be a part of this patient's care.  Dolly Rias RPh 05/27/2016, 2:18 PM Pager [phone]

## 2016-05-27 NOTE — Assessment & Plan Note (Addendum)
Patient has significant left arm edema; and apparent cellulitis to the left upper extremity above the elbow.  The erythema, warmth, and tenderness extends from just above the left elbow beyond the axilla site and into the left chest region.  There are no red streaks noted.

## 2016-05-27 NOTE — Assessment & Plan Note (Signed)
Patient states that approximately 2 weeks ago.  She felt ill; and had a full syncopal event at home.  When she fell.  She experienced right rib fractures.  She was seen in the emergency department at that time and diagnosed with rib fractures.  She states that she was sent home with oxycodone; but that she barely takes it.  She typically takes Tylenol for pain.  Exam today reveals tenderness to the right lateral ribs.  There is no obvious bruising, to the site.  Patient was given morphine IV prior to being transported to the emergency department today.

## 2016-05-30 ENCOUNTER — Ambulatory Visit (HOSPITAL_COMMUNITY): Payer: Medicare Other

## 2016-05-30 ENCOUNTER — Ambulatory Visit (HOSPITAL_COMMUNITY): Admission: RE | Admit: 2016-05-30 | Payer: Medicare Other | Source: Ambulatory Visit

## 2016-05-30 ENCOUNTER — Other Ambulatory Visit: Payer: Self-pay | Admitting: Oncology

## 2016-05-30 NOTE — Progress Notes (Signed)
[facility]  [phone] [phone]) [phone]     ID: RASHA IBE DOB: [birth year]  MR#: [id number]  EML#:[id number]  Patient Care Team: Caryl Bis, MD as PCP - General (Unknown Physician Specialty) PCP: Gar Ponto, MD Chauncey Cruel, MD OTHER MD:  CHIEF COMPLAINT: Neutropenia  CURRENT TREATMENT: Bone marrow biopsy pending   HISTORY OF PRESENT ILLNESS: Was in her usual state of health when in course of a routine physical exam was found to be slightly leukopenic. Her primary care physician repeated her counts and they were even lower. Accordingly she was referred here for further evaluation and on 05/06/2016 we obtain a CBC and differential with other labs. The white cell count was 1.3. The neutrophil count was 0.1. The hemoglobin was 13.2. The MCV was 92.9. The platelet count was 139,000. Review of the blood film showed no significant abnormalities in the red cell series. Specifically there were no detail poikilocytes or nucleated red cells. The platelets appear unremarkable. In the white cell series there was no clear left shift, and no definite blasts. There were some atypical mononuclear cell some of which were consistent with large granular lymphocytes.  Other labs obtained the same day included a ferritin of 72, a vitamin B12 at 242, and RBC folate of 1030, a sedimentation rate of 6, and LDH of 123, and an ANA that was positive at 1:80.  Her subsequent history is as detailed below   INTERVAL HISTORY: Tammy Ewing was evaluated in the hematology clinic 05/11/2016 accompanied by her husband Richard  REVIEW OF SYSTEMS: She denies unexplained fatigue or unexplained weight loss. There has been no rash, fever, or adenopathy. There have been no intercurrent infections. She has had no change in her functional status and is enjoying taking care of her 50-year-old great grandson. Overall she is well. A detailed review of systems today was otherwise  noncontributory  PAST MEDICAL HISTORY: Past Medical History:  Diagnosis Date  . Breast CA ([place])   . Breast cancer ([place]) 07/21/2011  . Breast cancer, left breast ([place]) 07/21/2011  . High cholesterol   . Hypertension   . Irritable bowel syndrome (IBS)     PAST SURGICAL HISTORY: Past Surgical History:  Procedure Laterality Date  . MASTECTOMY     bilateral   She status post left lumpectomy remotely, with bilateral implant reconstruction, subsequently the implant being "rejected". She status post cholecystectomy, and status post bilateral salpingo-oophorectomy without hysterectomy.  FAMILY HISTORY No family history on file. The patient has no information about her father. Her mother died at the age of 87. She has 2 half-brothers and 1 half-sister. There is no history of cancer in the family and specifically no history of hematologic problems that she is aware of   GYNECOLOGIC HISTORY:  No LMP recorded. Patient is postmenopausal. Menarche age 54, first live birth age 65, she is [place] P1. She stopped having periods at 89 and took hormone replacement approximately 8 years.  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 Her husband of more than 15 years, Delfino Lovett, used to work for [employer], but also is now retired. Their son Audry Pili is disabled secondary to multiple back surgeries. He lives in evening. The patient has one son and a 105-year-old great grandson. She is not a church attender although she is of the [employer]: Social History  Substance Use Topics  . Smoking status: Current Every Day Smoker  Packs/day: 1.00    Years: 50.00    Types: Cigarettes  . Smokeless tobacco: Never Used  . Alcohol use No     Colonoscopy:  PAP:  Bone density:   Allergies  Allergen Reactions  . Pregabalin Other (See Comments)    "Hallucinations"  . Other Rash    bandaids   . Tape Rash    Current Outpatient Prescriptions    Medication Sig Dispense Refill  . acetaminophen (TYLENOL) 500 MG tablet Take 1,000 mg by mouth every 6 (six) hours as needed for moderate pain.     . cetirizine (ZYRTEC) 10 MG tablet Take 10 mg by mouth daily as needed for allergies.     . cholestyramine (QUESTRAN) 4 GM/DOSE powder Take 1 packet (4 g total) by mouth 2 (two) times daily. (Patient not taking: Reported on 05/27/2016) 378 g 1  . clonazePAM (KLONOPIN) 1 MG tablet Take 0.5 mg by mouth at bedtime as needed (sleep).     . cyclobenzaprine (FLEXERIL) 5 MG tablet Take 1 tablet (5 mg total) by mouth 3 (three) times daily as needed for muscle spasms. 30 tablet 0  . fluorometholone (FML) 0.1 % ophthalmic suspension Place 1 drop into both eyes every 14 (fourteen) days.    Marland Kitchen imipramine (TOFRANIL) 25 MG tablet Take 25 mg by mouth at bedtime.      Marland Kitchen levothyroxine (SYNTHROID, LEVOTHROID) 75 MCG tablet Take 75 mcg by mouth daily before breakfast.    . metoprolol (TOPROL-XL) 50 MG 24 hr tablet Take 50 mg by mouth daily.      Marland Kitchen oxyCODONE (ROXICODONE) 5 MG immediate release tablet Take 1 tablet (5 mg total) by mouth every 4 (four) hours as needed for severe pain. 30 tablet 0  . pantoprazole (PROTONIX) 40 MG tablet Take 40 mg by mouth daily.    . simvastatin (ZOCOR) 40 MG tablet Take 20 mg by mouth at bedtime.      No current facility-administered medications for this visit.     OBJECTIVE: Middle-aged white woman who appears older than stated age There were no vitals filed for this visit.   There is no height or weight on file to calculate BMI.    ECOG FS:0 - Asymptomatic  Ocular: Sclerae unicteric, pupils equal, round and reactive to light Ear-nose-throat: Oropharynx clear and moist Lymphatic: No cervical or supraclavicular adenopathy Lungs no rales or rhonchi, good excursion bilaterally Heart regular rate and rhythm, no murmur appreciated Abd soft, nontender, positive bowel sounds MSK no focal spinal tenderness, no joint edema Neuro:  non-focal, well-oriented, appropriate affect Breasts: Status post bilateral mastectomies. There is no evidence of local recurrence. Both axillae are benign.   LAB RESULTS:  CMP     Component Value Date/Time   NA 133 (L) 05/27/2016 1219   K 3.4 (L) 05/27/2016 1219   CL 109 05/20/2016 0556   CL 107 01/10/2013 0905   CO2 24 05/27/2016 1219   GLUCOSE 105 05/27/2016 1219   GLUCOSE 101 (H) 01/10/2013 0905   BUN 11.4 05/27/2016 1219   CREATININE 0.8 05/27/2016 1219   CALCIUM 8.8 05/27/2016 1219   PROT 6.7 05/27/2016 1219   ALBUMIN 2.7 (L) 05/27/2016 1219   AST 14 05/27/2016 1219   ALT <9 05/27/2016 1219   ALKPHOS 137 05/27/2016 1219   BILITOT 0.57 05/27/2016 1219   GFRNONAA >60 05/20/2016 0556   GFRAA >60 05/20/2016 0556    INo results found for: SPEP, UPEP  Lab Results  Component Value Date   WBC 2.9 (  L) 05/27/2016   NEUTROABS 0.5 (L) 05/17/2016   HGB 12.6 05/27/2016   HCT 38.7 05/27/2016   MCV 94.6 05/27/2016   PLT Clumped Platelets--Appears Adequate 05/27/2016      Chemistry      Component Value Date/Time   NA 133 (L) 05/27/2016 1219   K 3.4 (L) 05/27/2016 1219   CL 109 05/20/2016 0556   CL 107 01/10/2013 0905   CO2 24 05/27/2016 1219   BUN 11.4 05/27/2016 1219   CREATININE 0.8 05/27/2016 1219      Component Value Date/Time   CALCIUM 8.8 05/27/2016 1219   ALKPHOS 137 05/27/2016 1219   AST 14 05/27/2016 1219   ALT <9 05/27/2016 1219   BILITOT 0.57 05/27/2016 1219       Lab Results  Component Value Date   LABCA2 15 01/09/2012    No components found for: [id number]  No results for input(s): INR in the last 168 hours.  Urinalysis    Component Value Date/Time   COLORURINE ORANGE (A) 05/17/2016 0845   APPEARANCEUR CLOUDY (A) 05/17/2016 0845   LABSPEC 1.037 (H) 05/17/2016 0845   PHURINE 6.0 05/17/2016 0845   GLUCOSEU 100 (A) 05/17/2016 0845   HGBUR TRACE (A) 05/17/2016 0845   BILIRUBINUR MODERATE (A) 05/17/2016 0845   KETONESUR NEGATIVE 05/17/2016  0845   PROTEINUR 100 (A) 05/17/2016 0845   UROBILINOGEN 1.0 04/15/2013 0446   NITRITE POSITIVE (A) 05/17/2016 0845   LEUKOCYTESUR SMALL (A) 05/17/2016 0845     STUDIES: Dg Ribs Unilateral W/chest Right  Result Date: 05/17/2016 CLINICAL DATA:  Pt was sitting on toilet this a.m and got lightheaded and fell against tub hitting rt lateral ribs, pain rt lateral ribs with some sob, no other chest complaints EXAM: RIGHT RIBS AND CHEST - 3+ VIEW COMPARISON:  07/14/2011 FINDINGS: Heart size is normal. The lungs are clear. There are no focal consolidations or pleural effusions. No pulmonary edema. Surgical clips are identified in the axillary regions and right upper quadrant. Oblique views of the ribs show an acute fracture of the right lateral eighth and ninth ribs. No pneumothorax. IMPRESSION: 1. Fracture of the right eighth and ninth ribs. 2. No pneumothorax or acute pulmonary abnormality. Electronically Signed   By: Nolon Nations M.D.   On: 05/17/2016 09:34   Ct Abdomen Pelvis W Contrast  Result Date: 05/17/2016 CLINICAL DATA:  Fall. EXAM: CT ABDOMEN AND PELVIS WITH CONTRAST TECHNIQUE: Multidetector CT imaging of the abdomen and pelvis was performed using the standard protocol following bolus administration of intravenous contrast. CONTRAST:  176m ISOVUE-300 IOPAMIDOL (ISOVUE-300) INJECTION 61%, 375mISOVUE-300 IOPAMIDOL (ISOVUE-300) INJECTION 61% COMPARISON:  04/15/2013 FINDINGS: Lower chest: There is no pleural or pericardial effusion. Hepatobiliary: Exophytic cyst arising from segment 5 of the liver measures 1.3 cm in is unchanged from previous exam. Previous cholecystectomy. No common bile duct dilatation. Pancreas: Unremarkable. No pancreatic ductal dilatation or surrounding inflammatory changes. Spleen: Normal in size without focal abnormality. Adrenals/Urinary Tract: Adrenal glands are unremarkable. Kidneys are normal, without renal calculi, focal lesion, or hydronephrosis. Bladder is  unremarkable. Stomach/Bowel: The stomach is normal. The small bowel loops have a normal course and caliber. There is no bowel obstruction. Normal appearance of the terminal ileum. There is a 8.2 cm segment of abnormal proximal transverse colon. There is decrease in caliber of the bowel within the segment and there is abnormal wall thickening/ edema which measures up to 8 mm. Separately, there is a segment of sigmoid colon which is abnormally thickened with surrounding inflammation.  Wall thickness measures up to 1.3 cm, image 67 of series 2. There is diffuse surrounding inflammatory changes including fat stranding in hyperemia of the Vasa recta. No pathologic dilatation of the colon. There is no free air or fluid collections identified. Distal colonic diverticula noted. Vascular/Lymphatic: Calcified atherosclerotic disease involves the abdominal aorta. No aneurysm. No enlarged retroperitoneal or mesenteric adenopathy. No enlarged pelvic or inguinal lymph nodes. Reproductive: Uterus and bilateral adnexa are unremarkable. Other: There is no ascites or focal fluid collections within the abdomen or pelvis. Musculoskeletal: Degenerative disc disease identified within the lumbar spine. IMPRESSION: 1. There are 2 segments of abnormal: . The first segment involves the proximal transverse colon and measures approximately 8 cm. Here the bowel is decreased in caliber and there is abnormal wall thickening. The second segment involves the sigmoid colon were there is marked diffuse bowel wall thickening and inflammation. Findings are favored to reflect multifocal segmental colitis secondary to inflammatory bowel disease such as Crohn's disease. Less likely, inflammatory changes of the sigmoid colon may reflect diverticulitis with low grade inflammatory changes involving the proximal transverse colon secondary to inflammatory bowel disease. 2. No evidence for bowel obstruction, perforation or fistula formation. Electronically  Signed   By: Kerby Moors M.D.   On: 05/17/2016 10:38    ELIGIBLE FOR AVAILABLE RESEARCH PROTOCOL: no  ASSESSMENT: 70 y.o. [place] woman with unexplained leukopenia  (1) history of breast cancer, bilateral, status post bilateral mastectomies, status post 5 years of anti-estrogens(tamoxifen for 2 years, letrozole for 3 years) remotely  (2) progressive and severe leukopenia and neutropenia noted August 2017, in the absence of anemia and only mild thrombocytopenia, with review of the blood film nondiagnostic   PLAN: We spent the better part of today's hour-long appointment discussing the biology of blood cells in the different kinds of cytopenias. Specifically Tammy Ewing understands that all blood cells grow in the marrow of bones. There were 3 basic cell lines. The red cells carry oxygen and she has red cells which are normal in number and normal in morphology. A second line consists of the platelets. These are clotting cells. She has a normal number of platelets.  Her problem is the white cell line. There has been a rapid decrease in her white cells to the current severe neutropenia. On the other hand I don't see clear evidence of circulating blasts, I don't see a lymphocytosis or evidence of hairy cells, and in short review of the blood film is not diagnostic.  We obtained routine B12 folate and ferritin labs as well as an ESR and all of those were normal, as was the LDH. What this means is that we don't see an obvious nutritional deficiency, and that there is no clear evidence of a primary inflammatory condition such as lupus.  She does have a positive ANA though it is low titer (1:80). This goes along with my expectation that what we are dealing with will be an immune reaction against her white cells, and if that is the case we will consider treating her with steroids initially and follow her response.  Before doing that we need to obtain a bone marrow biopsy. I am expecting that to rule out a  leukemia or other primary bone marrow problem since the other 2 cell lines are normal. If we do document a large granular cell leukemia we will consider adding methotrexate to the initial steroids.  In the absence of infection I do not see a role at present for granulocyte stimulating factors.  I did stress to Tammy Ewing that at this point her immune system is not functional and if she develops a fever she needs to call immediately and in any case be evaluated in the emergency room for likely admission. She is going to wash your hands after any interaction with anyone but I did not suggest that she simply lock herself up in her home. She just needs to avoid people who are sick or places where she can easily be infected either through direct trauma or other exposure  Tammy Ewing has a good understanding of this plan. She agrees with it. She knows to call for any problems that may develop before her next visit here, which will be in approximately 2 weeks.Marland Kitchen  Chauncey Cruel, MD   05/30/2016 11:09 AM Medical Oncology and Hematology [facility] [address] [place], [place] [address] Tel. [phone]    Fax. [phone]

## 2016-05-31 ENCOUNTER — Other Ambulatory Visit: Payer: Medicare Other

## 2016-06-02 LAB — CULTURE, BLOOD (SINGLE)

## 2016-06-05 DEATH — deceased

## 2016-06-06 ENCOUNTER — Encounter: Payer: Medicare Other | Admitting: Gastroenterology

## 2016-06-07 ENCOUNTER — Ambulatory Visit: Payer: Medicare Other | Admitting: Oncology

## 2016-06-13 ENCOUNTER — Ambulatory Visit (HOSPITAL_COMMUNITY): Payer: Medicare Other

## 2016-08-19 NOTE — Telephone Encounter (Signed)
No entry 

## 2016-11-14 IMAGING — CR DG RIBS W/ CHEST 3+V*R*
4 series · 4 of 4 positions shown · non-contrast
Comparison: 07/14/2011

CLINICAL DATA: Pt was sitting on toilet this a.m and got
lightheaded and fell against tub hitting rt lateral ribs, pain rt
lateral ribs with some sob, no other chest complaints

EXAM:
RIGHT RIBS AND CHEST - 3+ VIEW

[w chest pa]
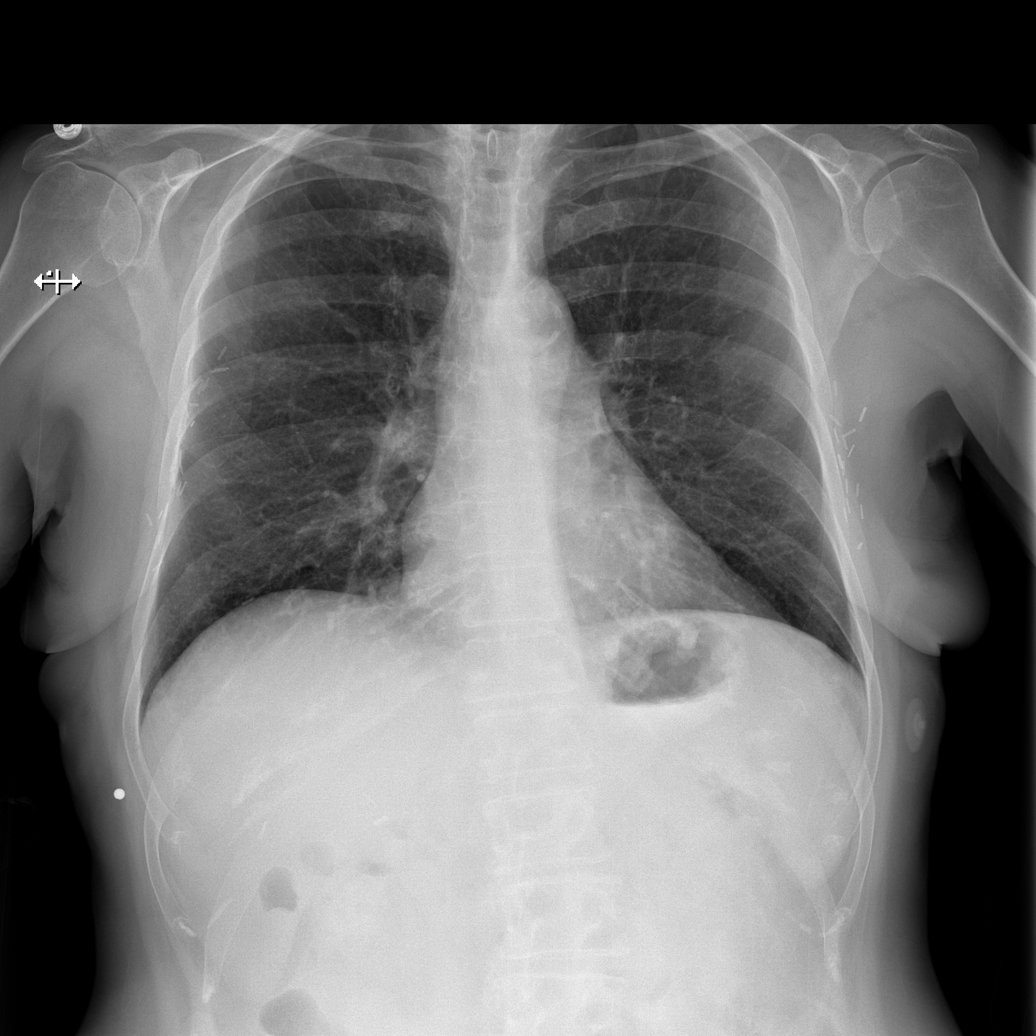

[w ribs obl right (1 of 3)]
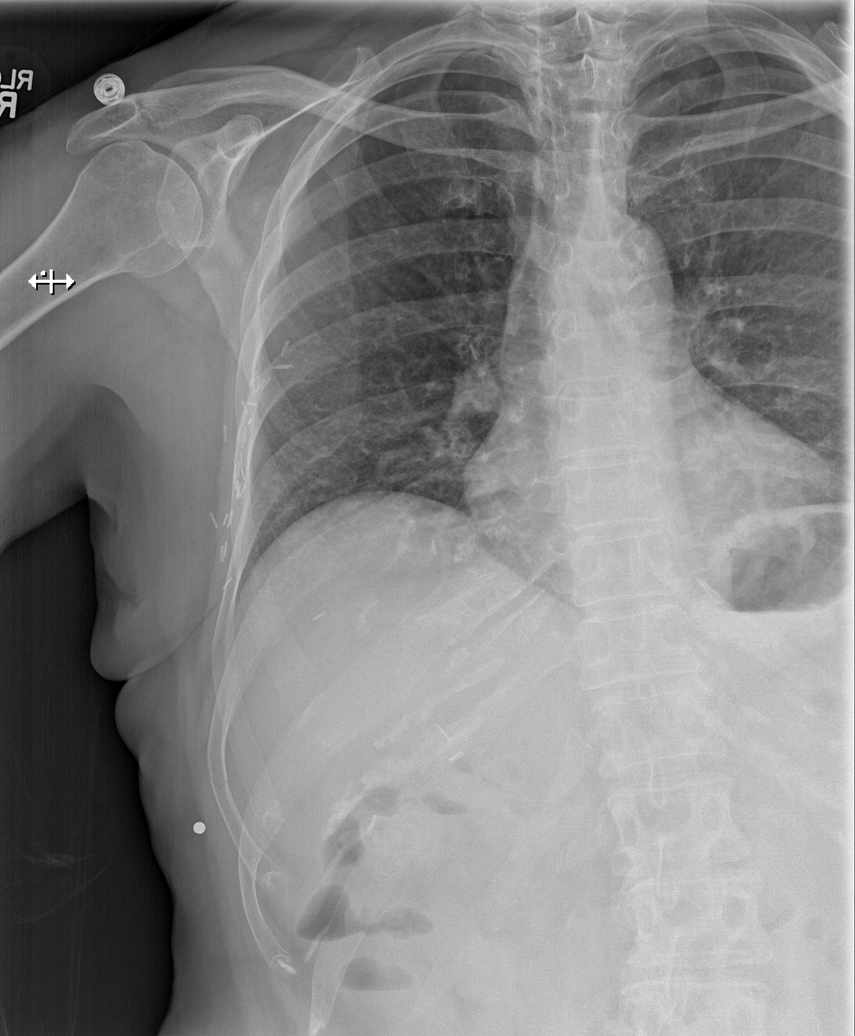

[w ribs obl right (2 of 3)]
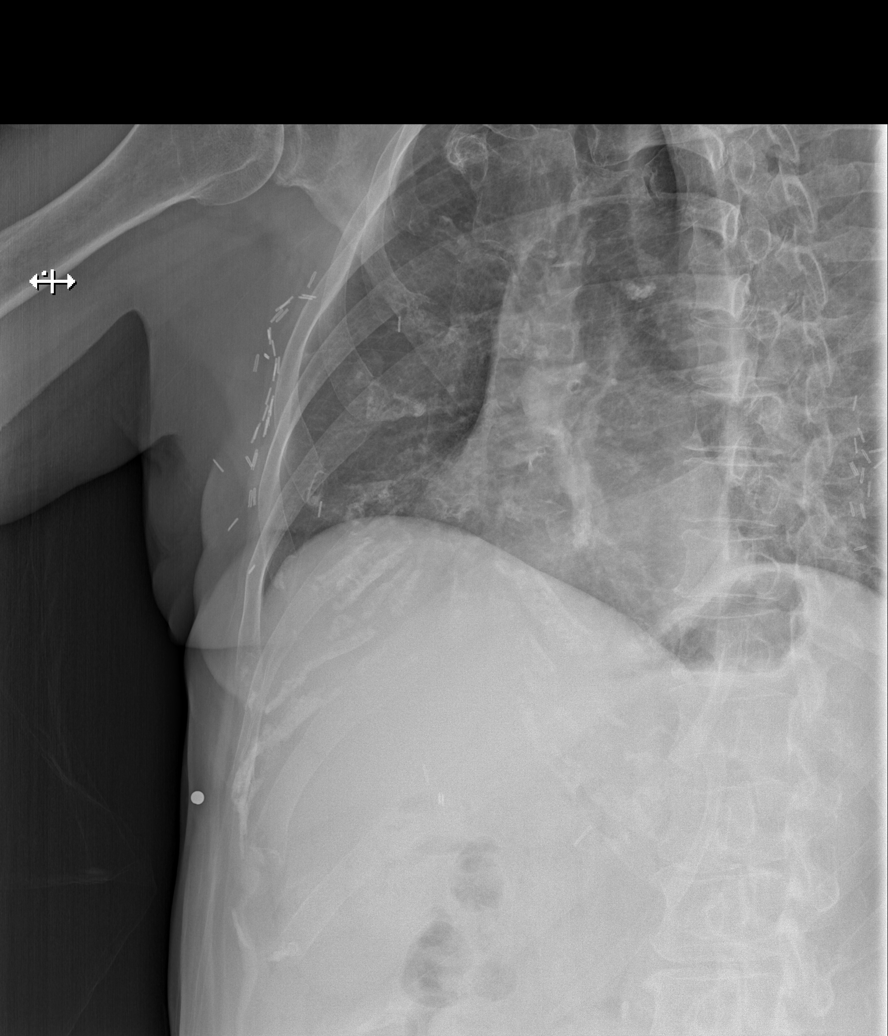

[w ribs obl right (3 of 3)]
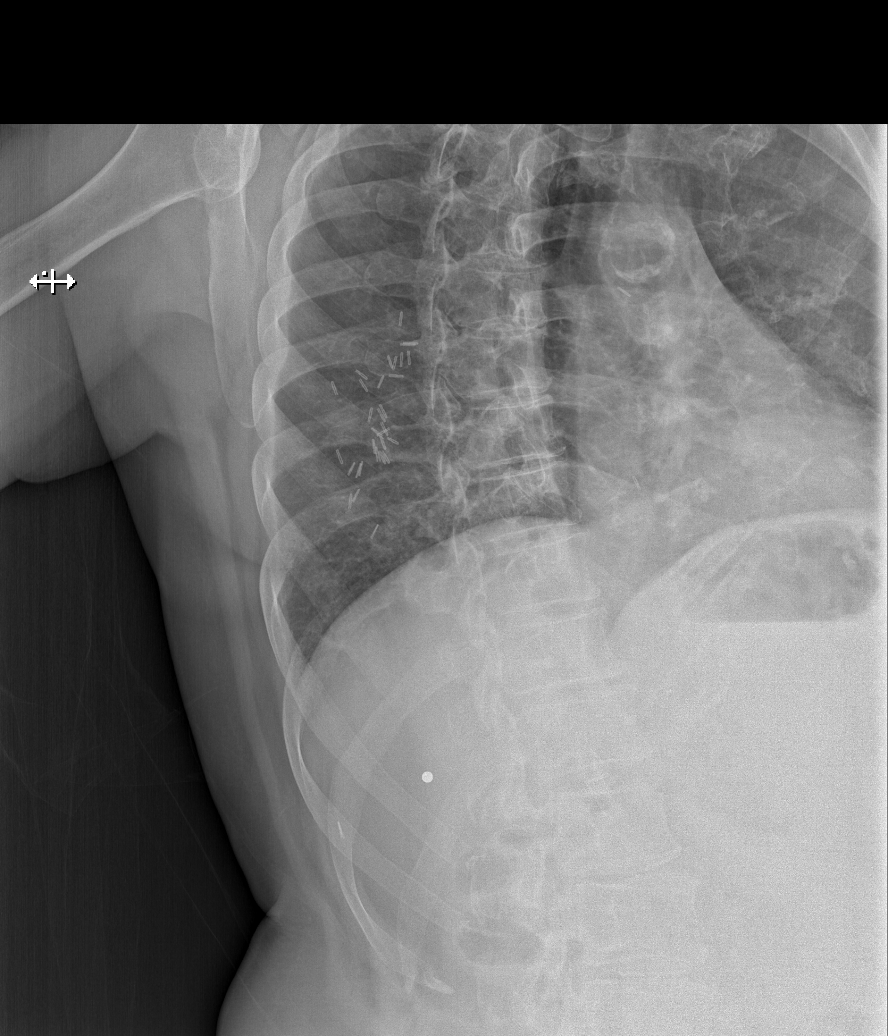

[4 of 4 positions shown; findings below may reference images not displayed]

FINDINGS: Heart size is normal. The lungs are clear. There are no focal
consolidations or pleural effusions. No pulmonary edema. Surgical
clips are identified in the axillary regions and right upper
quadrant. Oblique views of the ribs show an acute fracture of the
right lateral eighth and ninth ribs. No pneumothorax.
IMPRESSION: 1. Fracture of the right eighth and ninth ribs.
2. No pneumothorax or acute pulmonary abnormality.

## 2016-11-14 IMAGING — CT CT ABD-PELV W/ CM
2 of 5 series · 15 of 46 positions shown, 17 images · IV contrast (ISOVUE)
Comparison: 04/15/2013

CLINICAL DATA: Fall.

EXAM:
CT ABDOMEN AND PELVIS WITH CONTRAST
TECHNIQUE: Multidetector CT imaging of the abdomen and pelvis was performed
using the standard protocol following bolus administration of
intravenous contrast.
CONTRAST:  100mL G7STQ8-CRR IOPAMIDOL (G7STQ8-CRR) INJECTION 61%,
30mL G7STQ8-CRR IOPAMIDOL (G7STQ8-CRR) INJECTION 61%

[Series 2: abd/pel with · axial · 0.77mm/px · z∈[+1149,+1534]mm · 12 of 89 slices shown, 14 images]
[im 6/89  soft-tissue]
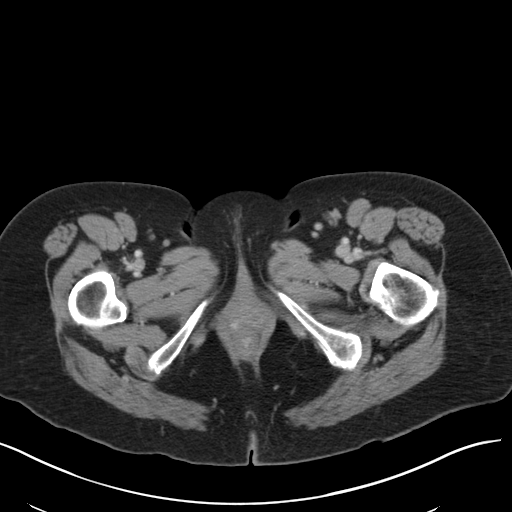
[im 6/89  bone]
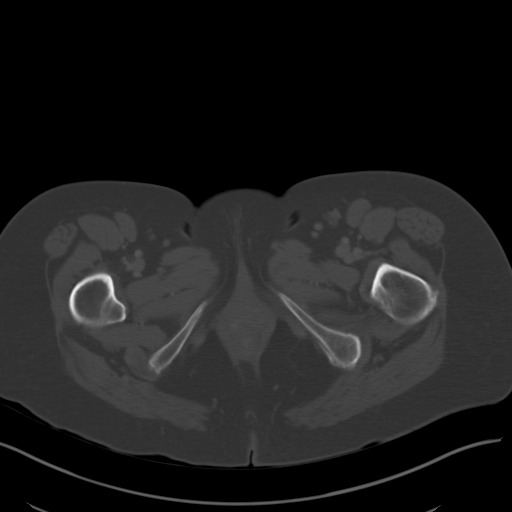
[im 16/89  soft-tissue]
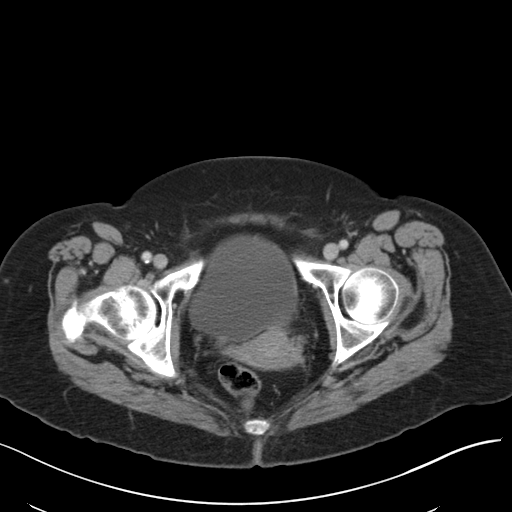
[im 21/89  soft-tissue]
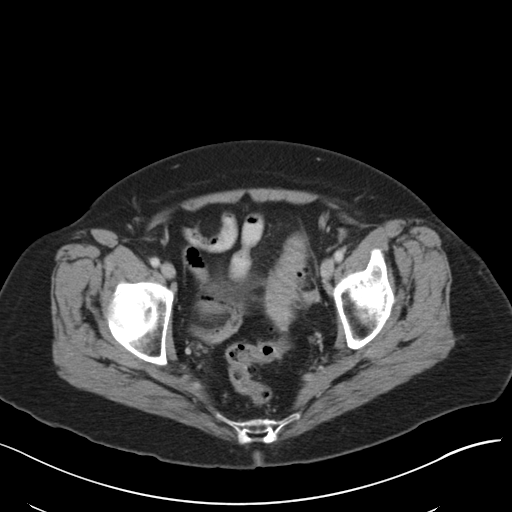
[im 26/89  soft-tissue]
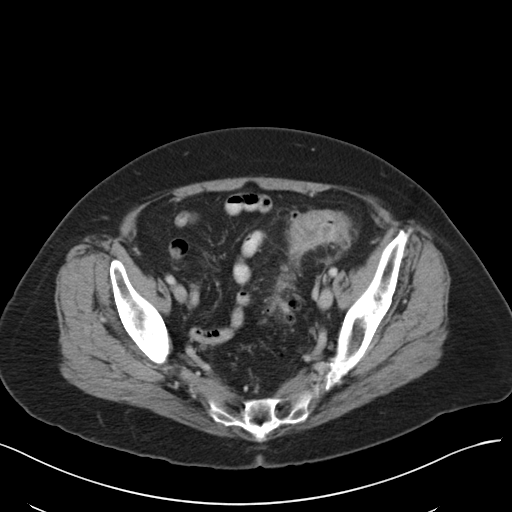
[im 37/89  soft-tissue]
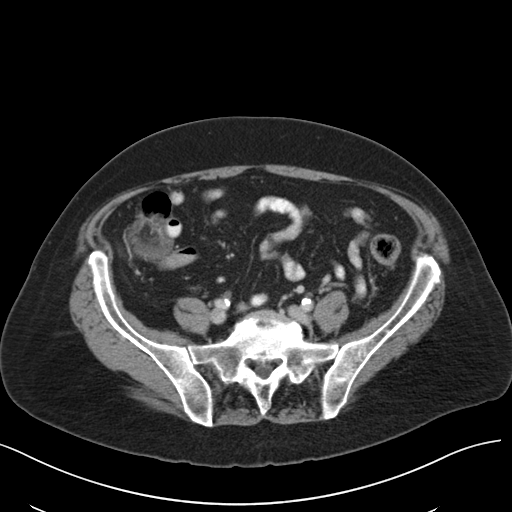
[im 42/89  soft-tissue]
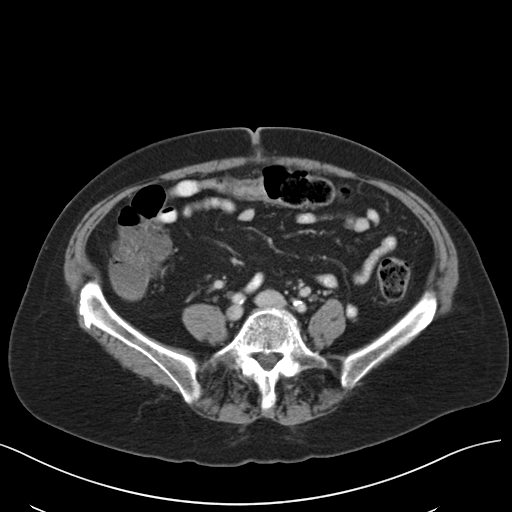
[im 47/89  soft-tissue]
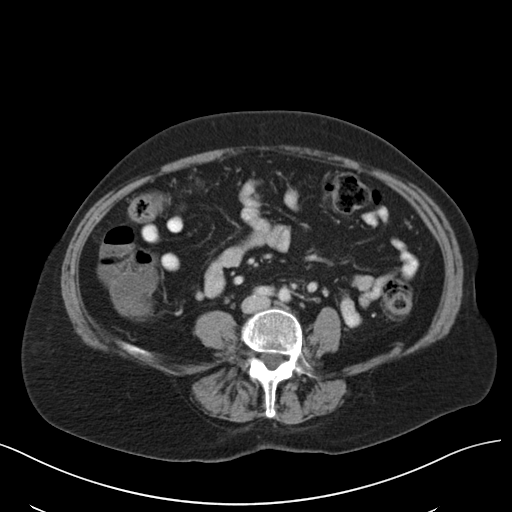
[im 57/89  soft-tissue]
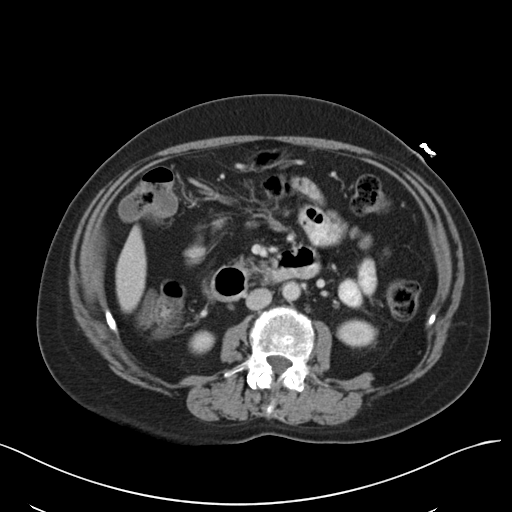
[im 63/89  soft-tissue]
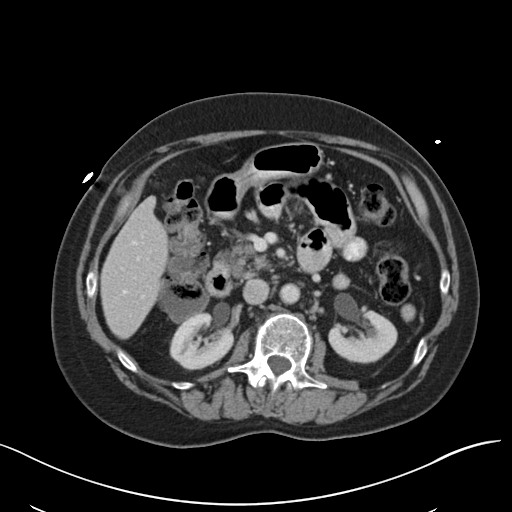
[im 63/89  bone]
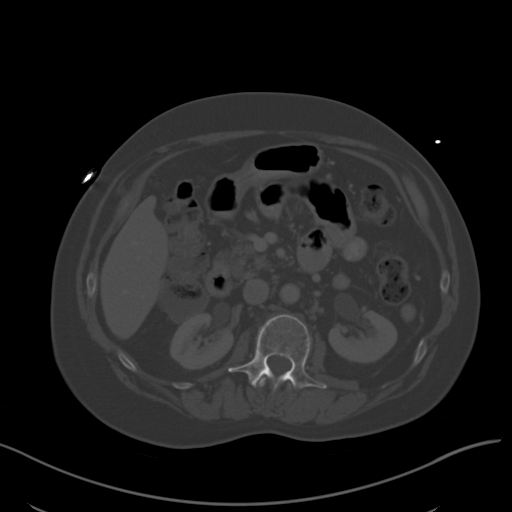
[im 68/89  soft-tissue]
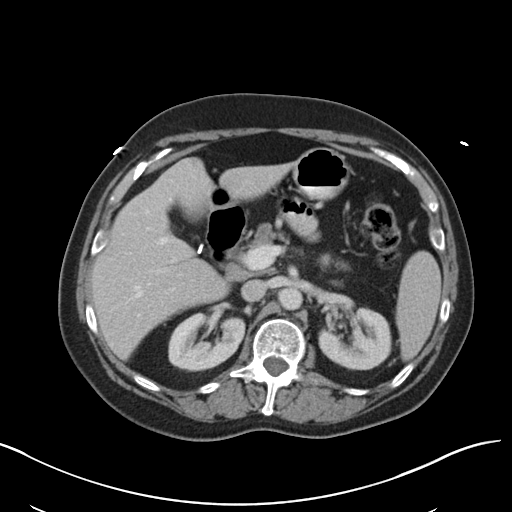
[im 78/89  soft-tissue]
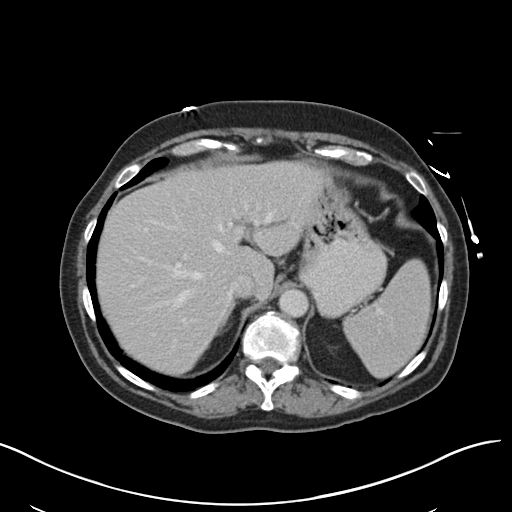
[im 83/89  soft-tissue]
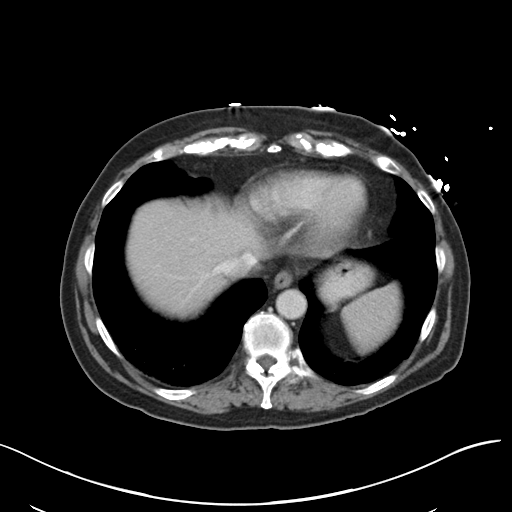

[Series 3: coronal a/|p · coronal · 0.86mm/px · 3 of 124 slices shown]
[im 42/124  soft-tissue]
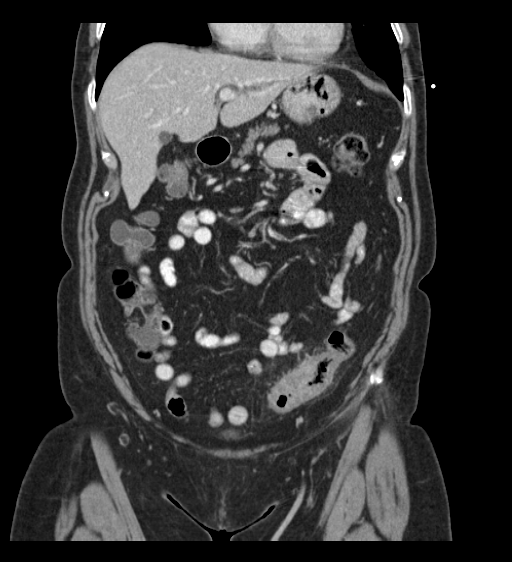
[im 55/124  soft-tissue]
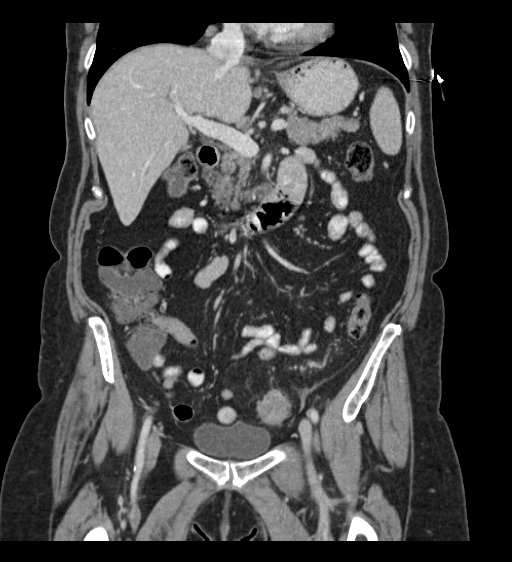
[im 69/124  soft-tissue]
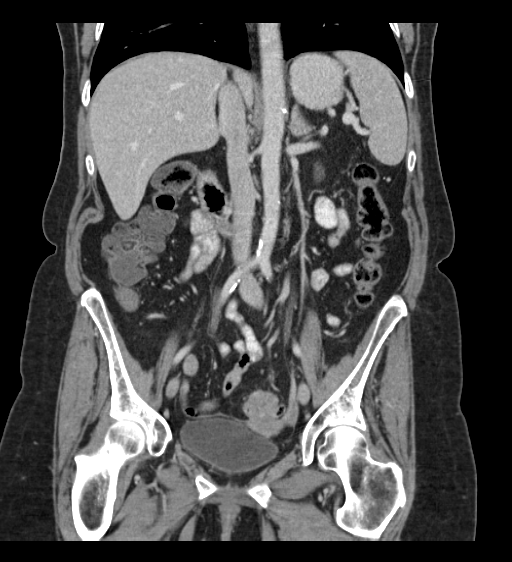

[15 of 46 positions shown; findings below may reference images not displayed]

FINDINGS: Lower chest: There is no pleural or pericardial effusion.

Hepatobiliary: Exophytic cyst arising from segment 5 of the liver
measures 1.3 cm in is unchanged from previous exam. Previous
cholecystectomy. No common bile duct dilatation.

Pancreas: Unremarkable. No pancreatic ductal dilatation or
surrounding inflammatory changes.

Spleen: Normal in size without focal abnormality.

Adrenals/Urinary Tract: Adrenal glands are unremarkable. Kidneys are
normal, without renal calculi, focal lesion, or hydronephrosis.
Bladder is unremarkable.

Stomach/Bowel: The stomach is normal. The small bowel loops have a
normal course and caliber. There is no bowel obstruction. Normal
appearance of the terminal ileum. There is a 8.2 cm segment of
abnormal proximal transverse colon. There is decrease in caliber of
the bowel within the segment and there is abnormal wall thickening/
edema which measures up to 8 mm. Separately, there is a segment of
sigmoid colon which is abnormally thickened with surrounding
inflammation. Wall thickness measures up to 1.3 cm, image 67 of
series 2. There is diffuse surrounding inflammatory changes
including fat stranding in hyperemia of the Vasa recta. No
pathologic dilatation of the colon. There is no free air or fluid
collections identified. Distal colonic diverticula noted.

Vascular/Lymphatic: Calcified atherosclerotic disease involves the
abdominal aorta. No aneurysm. No enlarged retroperitoneal or
mesenteric adenopathy. No enlarged pelvic or inguinal lymph nodes.

Reproductive: Uterus and bilateral adnexa are unremarkable.

Other: There is no ascites or focal fluid collections within the
abdomen or pelvis.

Musculoskeletal: Degenerative disc disease identified within the
lumbar spine.
IMPRESSION: 1. There are 2 segments of abnormal: . The first segment involves
the proximal transverse colon and measures approximately 8 cm. Here
the bowel is decreased in caliber and there is abnormal wall
thickening. The second segment involves the sigmoid colon were there
is marked diffuse bowel wall thickening and inflammation. Findings
are favored to reflect multifocal segmental colitis secondary to
inflammatory bowel disease such as Crohn's disease. Less likely,
inflammatory changes of the sigmoid colon may reflect diverticulitis
with low grade inflammatory changes involving the proximal
transverse colon secondary to inflammatory bowel disease.
2. No evidence for bowel obstruction, perforation or fistula
formation.
